# Patient Record
Sex: Male | Born: 1945 | Race: Black or African American | Hispanic: No | Marital: Married
Health system: Southern US, Community
[De-identification: ages and names within clinical notes are randomized; demographics above are authoritative.]

---

## 2020-08-16 ENCOUNTER — Other Ambulatory Visit (HOSPITAL_COMMUNITY): Payer: Medicare Other

## 2020-08-16 ENCOUNTER — Inpatient Hospital Stay
Admission: RE | Admit: 2020-08-16 | Discharge: 2020-11-28 | Disposition: A | Discharge status: Rehabilitation Facility | Payer: Medicare Other | Attending: Internal Medicine | Admitting: Internal Medicine

## 2020-08-16 DIAGNOSIS — R0602 Shortness of breath: Secondary | ICD-10-CM

## 2020-08-16 DIAGNOSIS — I639 Cerebral infarction, unspecified: Secondary | ICD-10-CM

## 2020-08-16 DIAGNOSIS — J189 Pneumonia, unspecified organism: Secondary | ICD-10-CM

## 2020-08-16 DIAGNOSIS — K5900 Constipation, unspecified: Secondary | ICD-10-CM

## 2020-08-16 DIAGNOSIS — G931 Anoxic brain damage, not elsewhere classified: Secondary | ICD-10-CM

## 2020-08-16 DIAGNOSIS — R52 Pain, unspecified: Secondary | ICD-10-CM

## 2020-08-16 DIAGNOSIS — T148XXA Other injury of unspecified body region, initial encounter: Secondary | ICD-10-CM

## 2020-08-16 DIAGNOSIS — J9690 Respiratory failure, unspecified, unspecified whether with hypoxia or hypercapnia: Secondary | ICD-10-CM

## 2020-08-16 DIAGNOSIS — R509 Fever, unspecified: Secondary | ICD-10-CM

## 2020-08-16 DIAGNOSIS — Z4659 Encounter for fitting and adjustment of other gastrointestinal appliance and device: Secondary | ICD-10-CM

## 2020-08-16 DIAGNOSIS — Z452 Encounter for adjustment and management of vascular access device: Secondary | ICD-10-CM

## 2020-08-16 DIAGNOSIS — K567 Ileus, unspecified: Secondary | ICD-10-CM

## 2020-08-16 DIAGNOSIS — I469 Cardiac arrest, cause unspecified: Secondary | ICD-10-CM

## 2020-08-16 DIAGNOSIS — Z9289 Personal history of other medical treatment: Secondary | ICD-10-CM

## 2020-08-16 DIAGNOSIS — J9621 Acute and chronic respiratory failure with hypoxia: Secondary | ICD-10-CM

## 2020-08-16 DIAGNOSIS — D72829 Elevated white blood cell count, unspecified: Secondary | ICD-10-CM

## 2020-08-16 LAB — BLOOD GAS, ARTERIAL
Acid-Base Excess: 3.5 mmol/L — ABNORMAL HIGH (ref 0.0–2.0)
Bicarbonate: 28.1 mmol/L — ABNORMAL HIGH (ref 20.0–28.0)
FIO2: 35
O2 Saturation: 96.7 %
Patient temperature: 37.5
pCO2 arterial: 47.9 mmHg (ref 32.0–48.0)
pH, Arterial: 7.388 (ref 7.350–7.450)
pO2, Arterial: 91.6 mmHg (ref 83.0–108.0)

## 2020-08-17 DIAGNOSIS — I639 Cerebral infarction, unspecified: Secondary | ICD-10-CM

## 2020-08-17 DIAGNOSIS — I469 Cardiac arrest, cause unspecified: Secondary | ICD-10-CM | POA: Diagnosis not present

## 2020-08-17 DIAGNOSIS — J9621 Acute and chronic respiratory failure with hypoxia: Secondary | ICD-10-CM

## 2020-08-17 DIAGNOSIS — J189 Pneumonia, unspecified organism: Secondary | ICD-10-CM

## 2020-08-17 DIAGNOSIS — G931 Anoxic brain damage, not elsewhere classified: Secondary | ICD-10-CM

## 2020-08-17 LAB — CBC
HCT: 27.8 % — ABNORMAL LOW (ref 39.0–52.0)
Hemoglobin: 8.4 g/dL — ABNORMAL LOW (ref 13.0–17.0)
MCH: 29.4 pg (ref 26.0–34.0)
MCHC: 30.2 g/dL (ref 30.0–36.0)
MCV: 97.2 fL (ref 80.0–100.0)
Platelets: 628 10*3/uL — ABNORMAL HIGH (ref 150–400)
RBC: 2.86 MIL/uL — ABNORMAL LOW (ref 4.22–5.81)
RDW: 15.9 % — ABNORMAL HIGH (ref 11.5–15.5)
WBC: 12.8 10*3/uL — ABNORMAL HIGH (ref 4.0–10.5)
nRBC: 0.2 % (ref 0.0–0.2)

## 2020-08-17 LAB — COMPREHENSIVE METABOLIC PANEL
ALT: 69 U/L — ABNORMAL HIGH (ref 0–44)
AST: 59 U/L — ABNORMAL HIGH (ref 15–41)
Albumin: 2.1 g/dL — ABNORMAL LOW (ref 3.5–5.0)
Alkaline Phosphatase: 131 U/L — ABNORMAL HIGH (ref 38–126)
Anion gap: 5 (ref 5–15)
BUN: 33 mg/dL — ABNORMAL HIGH (ref 8–23)
CO2: 29 mmol/L (ref 22–32)
Calcium: 8.9 mg/dL (ref 8.9–10.3)
Chloride: 114 mmol/L — ABNORMAL HIGH (ref 98–111)
Creatinine, Ser: 1.05 mg/dL (ref 0.61–1.24)
GFR, Estimated: >60 mL/min (ref >60)
Glucose, Bld: 154 mg/dL — ABNORMAL HIGH (ref 70–99)
Potassium: 4.7 mmol/L (ref 3.5–5.1)
Sodium: 148 mmol/L — ABNORMAL HIGH (ref 135–145)
Total Bilirubin: 0.8 mg/dL (ref 0.3–1.2)
Total Protein: 6.2 g/dL — ABNORMAL LOW (ref 6.5–8.1)

## 2020-08-17 LAB — URINALYSIS, ROUTINE W REFLEX MICROSCOPIC
Bilirubin Urine: NEGATIVE
Glucose, UA: NEGATIVE mg/dL
Ketones, ur: NEGATIVE mg/dL
Leukocytes,Ua: NEGATIVE
Nitrite: NEGATIVE
Protein, ur: NEGATIVE mg/dL
Specific Gravity, Urine: 1.015 (ref 1.005–1.030)
pH: 6 (ref 5.0–8.0)

## 2020-08-17 LAB — PROTIME-INR
INR: 1.2 (ref 0.8–1.2)
Prothrombin Time: 15 seconds (ref 11.4–15.2)

## 2020-08-17 LAB — URINALYSIS, MICROSCOPIC (REFLEX): Bacteria, UA: NONE SEEN

## 2020-08-17 LAB — TSH: TSH: 4.61 u[IU]/mL — ABNORMAL HIGH (ref 0.350–4.500)
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
## 2020-08-18 LAB — BASIC METABOLIC PANEL
Anion gap: 5 (ref 5–15)
BUN: 32 mg/dL — ABNORMAL HIGH (ref 8–23)
CO2: 27 mmol/L (ref 22–32)
Calcium: 8.3 mg/dL — ABNORMAL LOW (ref 8.9–10.3)
Chloride: 112 mmol/L — ABNORMAL HIGH (ref 98–111)
Creatinine, Ser: 0.98 mg/dL (ref 0.61–1.24)
GFR, Estimated: >60 mL/min (ref >60)
Glucose, Bld: 140 mg/dL — ABNORMAL HIGH (ref 70–99)
Potassium: 4.6 mmol/L (ref 3.5–5.1)
Sodium: 144 mmol/L (ref 135–145)

## 2020-08-18 LAB — CULTURE, RESPIRATORY W GRAM STAIN
Culture: NORMAL
Gram Stain: NONE SEEN

## 2020-08-20 DIAGNOSIS — J9621 Acute and chronic respiratory failure with hypoxia: Secondary | ICD-10-CM | POA: Diagnosis not present

## 2020-08-20 DIAGNOSIS — I469 Cardiac arrest, cause unspecified: Secondary | ICD-10-CM | POA: Diagnosis not present

## 2020-08-20 DIAGNOSIS — I639 Cerebral infarction, unspecified: Secondary | ICD-10-CM | POA: Diagnosis not present

## 2020-08-20 DIAGNOSIS — G931 Anoxic brain damage, not elsewhere classified: Secondary | ICD-10-CM | POA: Diagnosis not present

## 2020-08-20 NOTE — Progress Notes (Signed)
Pulmonary Critical Care Medicine [facility] [place]   PULMONARY CRITICAL CARE SERVICE  PROGRESS NOTE     Rick Wilson  FSE:[id number]  DOB: [birth year]   DOA: 08/16/2020  Referring Physician: Carron Curie, MD  HPI: Rick Wilson is a 75 y.o. male being followed for ventilator/airway/oxygen weaning Acute on Chronic Respiratory Failure.  Patient reportedly has very copious secretions.  Remains on the T collar right now on 28% FiO2.  Respiratory therapy reports frequent suctioning  Medications: Reviewed on Rounds  Physical Exam:  Vitals: Temperature was up to 101.7 pulse 99 respiratory rate 39 blood pressure 138/68 saturations were 100%  Ventilator Settings off the vent right now on T collar FiO2 28%  General: Comfortable at this time Neck: supple Cardiovascular: no malignant arrhythmias Respiratory: Coarse breath sounds rhonchi noted bilaterally Skin: no rash seen on limited exam Musculoskeletal: No gross abnormality Psychiatric:unable to assess Neurologic:no involuntary movements         Lab Data:   Basic Metabolic Panel: Recent Labs  Lab 08/17/20 0514 08/18/20 0646  NA 148* 144  K 4.7 4.6  CL 114* 112*  CO2 29 27  GLUCOSE 154* 140*  BUN 33* 32*  CREATININE 1.05 0.98  CALCIUM 8.9 8.3*    ABG: Recent Labs  Lab 08/16/20 1215  PHART 7.388  PCO2ART 47.9  PO2ART 91.6  HCO3 28.1*  O2SAT 96.7    Liver Function Tests: Recent Labs  Lab 08/17/20 0514  AST 59*  ALT 69*  ALKPHOS 131*  BILITOT 0.8  PROT 6.2*  ALBUMIN 2.1*   No results for input(s): LIPASE, AMYLASE in the last 168 hours. No results for input(s): AMMONIA in the last 168 hours.  CBC: Recent Labs  Lab 08/17/20 0514  WBC 12.8*  HGB 8.4*  HCT 27.8*  MCV 97.2  PLT 628*    Cardiac Enzymes: No results for input(s): CKTOTAL, CKMB, CKMBINDEX, TROPONINI in the last 168 hours.  BNP (last 3 results) No results for input(s): BNP in the last 8760 hours.  ProBNP (last 3  results) No results for input(s): PROBNP in the last 8760 hours.  Radiological Exams: No results found.  Assessment/Plan Active Problems:   Acute on chronic respiratory failure with hypoxia (HCC)   Cardiac arrest (HCC)   Chronic anoxic encephalopathy (HCC)   Cerebrovascular accident (CVA) (HCC)   Healthcare-associated pneumonia   Acute on chronic respiratory failure with hypoxia patient currently is on T collar has been on 28% FiO2 secretions are quite copious.  We will continue with oxygen therapy titrate as tolerated. Fever being worked up by primary team we will continue to monitor closely. Cardiac arrest rhythm is stable we will continue to monitor along closely. Chronic anoxic encephalopathy no change we will continue to follow Cerebrovascular accident supportive care Healthcare associated pneumonia has been treated with antibiotic   I have personally seen and evaluated the patient, evaluated laboratory and imaging results, formulated the assessment and plan and placed orders. The Patient requires high complexity decision making with multiple systems involvement.  Rounds were done with the Respiratory Therapy Director and Staff therapists and discussed with nursing staff also.  Yevonne Pax, MD [facility] Pulmonary Critical Care Medicine Sleep Medicine

## 2020-08-21 ENCOUNTER — Other Ambulatory Visit (HOSPITAL_COMMUNITY): Payer: Medicare Other

## 2020-08-21 DIAGNOSIS — G931 Anoxic brain damage, not elsewhere classified: Secondary | ICD-10-CM | POA: Diagnosis not present

## 2020-08-21 DIAGNOSIS — J9621 Acute and chronic respiratory failure with hypoxia: Secondary | ICD-10-CM

## 2020-08-21 DIAGNOSIS — I639 Cerebral infarction, unspecified: Secondary | ICD-10-CM

## 2020-08-21 DIAGNOSIS — I469 Cardiac arrest, cause unspecified: Secondary | ICD-10-CM

## 2020-08-21 DIAGNOSIS — J189 Pneumonia, unspecified organism: Secondary | ICD-10-CM

## 2020-08-21 LAB — URINALYSIS, ROUTINE W REFLEX MICROSCOPIC
Bacteria, UA: NONE SEEN
Bilirubin Urine: NEGATIVE
Glucose, UA: NEGATIVE mg/dL
Ketones, ur: NEGATIVE mg/dL
Leukocytes,Ua: NEGATIVE
Nitrite: NEGATIVE
Protein, ur: 30 mg/dL — AB
RBC / HPF: >50 RBC/hpf — ABNORMAL HIGH (ref 0–5)
Specific Gravity, Urine: 1.009 (ref 1.005–1.030)
pH: 6 (ref 5.0–8.0)

## 2020-08-21 NOTE — Progress Notes (Signed)
Pulmonary Critical Care Medicine [facility] [place]   PULMONARY CRITICAL CARE SERVICE  PROGRESS NOTE     Rick Wilson  HAL:[id number]  DOB: October 12, 1945   DOA: 08/16/2020  Referring Physician: Carron Curie, MD  HPI: Rick Wilson is a 75 y.o. male being followed for ventilator/airway/oxygen weaning Acute on Chronic Respiratory Failure.  Patient is comfortable right now without distress at this time remains off the ventilator on T collar secretions are still reportedly quite copious  Medications: Reviewed on Rounds  Physical Exam:  Vitals: Temperature is 98.9 pulse 89 respiratory rate is 33 blood pressure is 128/60 saturations 100%  Ventilator Settings currently is off the ventilator on T collar FiO2 28%  General: Comfortable at this time Neck: supple Cardiovascular: no malignant arrhythmias Respiratory: No rhonchi very coarse breath sound Skin: no rash seen on limited exam Musculoskeletal: No gross abnormality Psychiatric:unable to assess Neurologic:no involuntary movements         Lab Data:   Basic Metabolic Panel: Recent Labs  Lab 08/17/20 0514 08/18/20 0646  NA 148* 144  K 4.7 4.6  CL 114* 112*  CO2 29 27  GLUCOSE 154* 140*  BUN 33* 32*  CREATININE 1.05 0.98  CALCIUM 8.9 8.3*    ABG: Recent Labs  Lab 08/16/20 1215  PHART 7.388  PCO2ART 47.9  PO2ART 91.6  HCO3 28.1*  O2SAT 96.7    Liver Function Tests: Recent Labs  Lab 08/17/20 0514  AST 59*  ALT 69*  ALKPHOS 131*  BILITOT 0.8  PROT 6.2*  ALBUMIN 2.1*   No results for input(s): LIPASE, AMYLASE in the last 168 hours. No results for input(s): AMMONIA in the last 168 hours.  CBC: Recent Labs  Lab 08/17/20 0514  WBC 12.8*  HGB 8.4*  HCT 27.8*  MCV 97.2  PLT 628*    Cardiac Enzymes: No results for input(s): CKTOTAL, CKMB, CKMBINDEX, TROPONINI in the last 168 hours.  BNP (last 3 results) No results for input(s): BNP in the last 8760 hours.  ProBNP (last 3 results) No  results for input(s): PROBNP in the last 8760 hours.  Radiological Exams: CT ABDOMEN WO CONTRAST  Result Date: 08/21/2020 CLINICAL DATA:  Anatomic planning for gastrostomy tube EXAM: CT ABDOMEN WITHOUT CONTRAST TECHNIQUE: Multidetector CT imaging of the abdomen was performed following the standard protocol without IV contrast. COMPARISON:  None. FINDINGS: Lower chest: Small left pleural effusion and basilar atelectasis Hepatobiliary: No focal liver abnormality is seen. No gallstones, gallbladder wall thickening, or biliary dilatation. Pancreas: Unremarkable. No pancreatic ductal dilatation or surrounding inflammatory changes. Spleen: Normal in size without focal abnormality. Adrenals/Urinary Tract: Adrenal glands are unremarkable. Kidneys are normal, without renal calculi, focal lesion, or hydronephrosis. Stomach/Bowel: The distal transverse colon is interposed between the stomach in the anterior abdominal wall. Vascular/Lymphatic: No significant vascular findings are present. No enlarged abdominal or pelvic lymph nodes. Other: No abdominal wall hernia or abnormality. Musculoskeletal: Left sacroiliac fusion IMPRESSION: 1. The distal transverse colon is interposed between the stomach in the anterior abdominal wall. 2. Small left pleural effusion and basilar atelectasis. Electronically Signed   By: Deatra Robinson M.D.   On: 08/21/2020 03:03    Assessment/Plan Active Problems:   Acute on chronic respiratory failure with hypoxia (HCC)   Cardiac arrest (HCC)   Chronic anoxic encephalopathy (HCC)   Cerebrovascular accident (CVA) (HCC)   Healthcare-associated pneumonia   Acute on chronic respiratory failure hypoxia we will continue with the T-piece titrate oxygen limiting factor of secretions at this time Cardiac  arrest rhythm is stable at this time we will continue to follow along closely Chronic anoxic encephalopathy no change Cerebrovascular accident patient is at baseline Healthcare associated  pneumonia treated we will continue to follow along   I have personally seen and evaluated the patient, evaluated laboratory and imaging results, formulated the assessment and plan and placed orders. The Patient requires high complexity decision making with multiple systems involvement.  Rounds were done with the Respiratory Therapy Director and Staff therapists and discussed with nursing staff also.  Yevonne Pax, MD [facility] Pulmonary Critical Care Medicine Sleep Medicine

## 2020-08-22 ENCOUNTER — Other Ambulatory Visit (HOSPITAL_COMMUNITY): Payer: Medicare Other

## 2020-08-22 DIAGNOSIS — G931 Anoxic brain damage, not elsewhere classified: Secondary | ICD-10-CM | POA: Diagnosis not present

## 2020-08-22 DIAGNOSIS — I639 Cerebral infarction, unspecified: Secondary | ICD-10-CM | POA: Diagnosis not present

## 2020-08-22 DIAGNOSIS — J9621 Acute and chronic respiratory failure with hypoxia: Secondary | ICD-10-CM | POA: Diagnosis not present

## 2020-08-22 DIAGNOSIS — I469 Cardiac arrest, cause unspecified: Secondary | ICD-10-CM | POA: Diagnosis not present

## 2020-08-22 LAB — CBC
HCT: 25 % — ABNORMAL LOW (ref 39.0–52.0)
Hemoglobin: 7.9 g/dL — ABNORMAL LOW (ref 13.0–17.0)
MCH: 29.4 pg (ref 26.0–34.0)
MCHC: 31.6 g/dL (ref 30.0–36.0)
MCV: 92.9 fL (ref 80.0–100.0)
Platelets: 606 10*3/uL — ABNORMAL HIGH (ref 150–400)
RBC: 2.69 MIL/uL — ABNORMAL LOW (ref 4.22–5.81)
RDW: 13.9 % (ref 11.5–15.5)
WBC: 7.3 10*3/uL (ref 4.0–10.5)
nRBC: 0 % (ref 0.0–0.2)

## 2020-08-22 LAB — URINE CULTURE: Culture: NO GROWTH

## 2020-08-22 MED ORDER — IOHEXOL 300 MG/ML  SOLN
75.0000 mL | Freq: Once | INTRAMUSCULAR | Status: AC | PRN
  Administered 2020-08-22: 75 mL via INTRAVENOUS
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
## 2020-08-22 NOTE — Progress Notes (Signed)
Pulmonary Critical Care Medicine [facility] [place]   PULMONARY CRITICAL CARE SERVICE  PROGRESS NOTE     Pheng Prokop  NLZ:[id number]  DOB: 1945/11/03   DOA: 08/16/2020  Referring Physician: Carron Curie, MD  HPI: Rondal Vandevelde is a 75 y.o. male being followed for ventilator/airway/oxygen weaning Acute on Chronic Respiratory Failure.  Patient is on T collar currently on room air doing fairly well.  Medications: Reviewed on Rounds  Physical Exam:  Vitals: Temperature is 98.2 pulse 89 respiratory rate is 35 blood pressure is 139/73 saturations 100%  Ventilator Settings off the ventilator right now on room air  General: Comfortable at this time Neck: supple Cardiovascular: no malignant arrhythmias Respiratory: Scattered rhonchi expansion is equal Skin: no rash seen on limited exam Musculoskeletal: No gross abnormality Psychiatric:unable to assess Neurologic:no involuntary movements         Lab Data:   Basic Metabolic Panel: Recent Labs  Lab 08/17/20 0514 08/18/20 0646  NA 148* 144  K 4.7 4.6  CL 114* 112*  CO2 29 27  GLUCOSE 154* 140*  BUN 33* 32*  CREATININE 1.05 0.98  CALCIUM 8.9 8.3*    ABG: Recent Labs  Lab 08/16/20 1215  PHART 7.388  PCO2ART 47.9  PO2ART 91.6  HCO3 28.1*  O2SAT 96.7    Liver Function Tests: Recent Labs  Lab 08/17/20 0514  AST 59*  ALT 69*  ALKPHOS 131*  BILITOT 0.8  PROT 6.2*  ALBUMIN 2.1*   No results for input(s): LIPASE, AMYLASE in the last 168 hours. No results for input(s): AMMONIA in the last 168 hours.  CBC: Recent Labs  Lab 08/17/20 0514  WBC 12.8*  HGB 8.4*  HCT 27.8*  MCV 97.2  PLT 628*    Cardiac Enzymes: No results for input(s): CKTOTAL, CKMB, CKMBINDEX, TROPONINI in the last 168 hours.  BNP (last 3 results) No results for input(s): BNP in the last 8760 hours.  ProBNP (last 3 results) No results for input(s): PROBNP in the last 8760 hours.  Radiological Exams: CT ABDOMEN WO  CONTRAST  Result Date: 08/21/2020 CLINICAL DATA:  Anatomic planning for gastrostomy tube EXAM: CT ABDOMEN WITHOUT CONTRAST TECHNIQUE: Multidetector CT imaging of the abdomen was performed following the standard protocol without IV contrast. COMPARISON:  None. FINDINGS: Lower chest: Small left pleural effusion and basilar atelectasis Hepatobiliary: No focal liver abnormality is seen. No gallstones, gallbladder wall thickening, or biliary dilatation. Pancreas: Unremarkable. No pancreatic ductal dilatation or surrounding inflammatory changes. Spleen: Normal in size without focal abnormality. Adrenals/Urinary Tract: Adrenal glands are unremarkable. Kidneys are normal, without renal calculi, focal lesion, or hydronephrosis. Stomach/Bowel: The distal transverse colon is interposed between the stomach in the anterior abdominal wall. Vascular/Lymphatic: No significant vascular findings are present. No enlarged abdominal or pelvic lymph nodes. Other: No abdominal wall hernia or abnormality. Musculoskeletal: Left sacroiliac fusion IMPRESSION: 1. The distal transverse colon is interposed between the stomach in the anterior abdominal wall. 2. Small left pleural effusion and basilar atelectasis. Electronically Signed   By: Deatra Robinson M.D.   On: 08/21/2020 03:03   CT SOFT TISSUE NECK W CONTRAST  Result Date: 08/22/2020 CLINICAL DATA:  Neck pain, chronic, no prior imaging EXAM: CT NECK WITH CONTRAST TECHNIQUE: Multidetector CT imaging of the neck was performed using the standard protocol following the bolus administration of intravenous contrast. CONTRAST:  8mL OMNIPAQUE IOHEXOL 300 MG/ML  SOLN COMPARISON:  None. FINDINGS: PHARYNX AND LARYNX: There is circumferential soft tissue thickening of the supraglottic larynx. Tracheostomy tube tip  is at the level of the clavicular heads. No retropharyngeal abnormality. SALIVARY GLANDS: Normal parotid, submandibular and sublingual glands. THYROID: Normal. LYMPH NODES: No enlarged  or abnormal density lymph nodes. VASCULAR: Major cervical vessels are patent. LIMITED INTRACRANIAL: Normal. VISUALIZED ORBITS: Normal. MASTOIDS AND VISUALIZED PARANASAL SINUSES: Partial opacification of the maxillary sinuses and near complete opacification of the sphenoid sinus. SKELETON: No bony spinal canal stenosis. No lytic or blastic lesions. UPPER CHEST: Incompletely visualized left pleural effusion. OTHER: None. IMPRESSION: 1. Circumferential edema of the supraglottic larynx. 2. Tracheostomy tube tip is at the level of the clavicular heads. Electronically Signed   By: Deatra Robinson M.D.   On: 08/22/2020 02:32   CT CHEST W CONTRAST  Result Date: 08/22/2020 CLINICAL DATA:  Pneumonia EXAM: CT CHEST WITH CONTRAST TECHNIQUE: Multidetector CT imaging of the chest was performed during intravenous contrast administration. CONTRAST:  78mL OMNIPAQUE IOHEXOL 300 MG/ML  SOLN COMPARISON:  Chest radiograph dated 08/16/2020 FINDINGS: Cardiovascular: Heart is top-normal in size. No pericardial effusion. No evidence of thoracic aortic aneurysm. Mild atherosclerotic calcifications of the aortic arch. Coronary atherosclerosis of the LAD. Mediastinum/Nodes: No suspicious mediastinal lymphadenopathy. Visualized thyroid is unremarkable. Lungs/Pleura: Tracheostomy in satisfactory position. Evaluation of the lung parenchyma is constrained by respiratory motion. Within that constraint, there are no suspicious pulmonary nodules. Small left pleural effusion. Associated left lower lobe opacity, likely atelectasis. Trace right pleural effusion Mild linear scarring/atelectasis in the right upper lobe (series 8/image 44) and superior segment right lower lobe (series 8/image 53). No focal consolidation. No pneumothorax. Upper Abdomen: Enteric tube terminates in the antral pyloric region. Musculoskeletal: Comminuted left lateral clavicle fracture (series 4/image 8). Segmental left lateral and anterior 2nd through 4th rib fractures. Left  lateral 5th through 9th rib fractures. Suspected nondisplaced right anterolateral 3rd through 6th rib fractures. Sternum, scapulae, and thoracic spine are within normal limits. IMPRESSION: Multiple bilateral rib fractures, as above. Comminuted left lateral clavicle fracture. Small left and trace right pleural effusions. Associated left lower lobe opacity, likely atelectasis. No findings suspicious for pneumonia. Support apparatus as above. Aortic Atherosclerosis (ICD10-I70.0). Electronically Signed   By: Charline Bills M.D.   On: 08/22/2020 02:35    Assessment/Plan Active Problems:   Acute on chronic respiratory failure with hypoxia (HCC)   Cardiac arrest (HCC)   Chronic anoxic encephalopathy (HCC)   Cerebrovascular accident (CVA) (HCC)   Healthcare-associated pneumonia   Acute on chronic respiratory failure with hypoxia continue with the T collar titrate oxygen as tolerated right now is not requiring any oxygen secretions are moderate Cardiac arrest rhythm is stable we will monitor closely Chronic anoxic encephalopathy no change continue to follow along closely CVA no change supportive care at this time Healthcare associated pneumonia treated   I have personally seen and evaluated the patient, evaluated laboratory and imaging results, formulated the assessment and plan and placed orders. The Patient requires high complexity decision making with multiple systems involvement.  Rounds were done with the Respiratory Therapy Director and Staff therapists and discussed with nursing staff also.  Yevonne Pax, MD [facility] Pulmonary Critical Care Medicine Sleep Medicine

## 2020-08-23 ENCOUNTER — Other Ambulatory Visit (HOSPITAL_COMMUNITY): Payer: Medicare Other

## 2020-08-23 DIAGNOSIS — I469 Cardiac arrest, cause unspecified: Secondary | ICD-10-CM | POA: Diagnosis not present

## 2020-08-23 DIAGNOSIS — I639 Cerebral infarction, unspecified: Secondary | ICD-10-CM | POA: Diagnosis not present

## 2020-08-23 DIAGNOSIS — G931 Anoxic brain damage, not elsewhere classified: Secondary | ICD-10-CM | POA: Diagnosis not present

## 2020-08-23 DIAGNOSIS — J9621 Acute and chronic respiratory failure with hypoxia: Secondary | ICD-10-CM | POA: Diagnosis not present

## 2020-08-23 HISTORY — PX: IR GASTROSTOMY TUBE MOD SED: IMG625

## 2020-08-23 LAB — CULTURE, RESPIRATORY W GRAM STAIN

## 2020-08-23 MED ORDER — STERILE WATER FOR INJECTION IJ SOLN
INTRAMUSCULAR | Status: AC
  Filled 2020-08-23: qty 10

## 2020-08-23 MED ORDER — CEFAZOLIN SODIUM-DEXTROSE 2-4 GM/100ML-% IV SOLN
INTRAVENOUS | Status: AC
  Administered 2020-08-23: 2000 mg
  Filled 2020-08-23: qty 100

## 2020-08-23 MED ORDER — IOHEXOL 300 MG/ML  SOLN
50.0000 mL | Freq: Once | INTRAMUSCULAR | Status: AC | PRN
  Administered 2020-08-23: 20 mL

## 2020-08-23 MED ORDER — LIDOCAINE-EPINEPHRINE 1 %-1:100000 IJ SOLN
INTRAMUSCULAR | Status: AC
  Filled 2020-08-23: qty 1

## 2020-08-23 MED ORDER — MIDAZOLAM HCL 2 MG/2ML IJ SOLN
INTRAMUSCULAR | Status: AC | PRN
  Administered 2020-08-23 (×2): 1 mg via INTRAVENOUS

## 2020-08-23 MED ORDER — MIDAZOLAM HCL 2 MG/2ML IJ SOLN
INTRAMUSCULAR | Status: AC
  Filled 2020-08-23: qty 2

## 2020-08-23 MED ORDER — FENTANYL CITRATE (PF) 100 MCG/2ML IJ SOLN
INTRAMUSCULAR | Status: AC | PRN
  Administered 2020-08-23: 25 ug via INTRAVENOUS
  Administered 2020-08-23: 50 ug via INTRAVENOUS

## 2020-08-23 MED ORDER — FENTANYL CITRATE (PF) 100 MCG/2ML IJ SOLN
INTRAMUSCULAR | Status: AC
  Filled 2020-08-23: qty 2

## 2020-08-23 MED ORDER — CEFAZOLIN (ANCEF) 1 G IV SOLR: INTRAVENOUS | Status: AC | PRN

## 2020-08-23 MED ORDER — LIDOCAINE-EPINEPHRINE (PF) 1 %-1:200000 IJ SOLN
INTRAMUSCULAR | Status: AC | PRN
Start: 2020-08-23 — End: 2020-08-23
  Administered 2020-08-23: 20 mL

## 2020-08-23 NOTE — Progress Notes (Signed)
Pulmonary Critical Care Medicine [facility] [place]   PULMONARY CRITICAL CARE SERVICE  PROGRESS NOTE     Rick Wilson  WTU:[id number]  DOB: 02-18-1945   DOA: 08/16/2020  Referring Physician: Carron Curie, MD  HPI: Rick Wilson is a 75 y.o. male being followed for ventilator/airway/oxygen weaning Acute on Chronic Respiratory Failure.  Patient is currently off the ventilator has been on T collar and is on room air doing quite well.  Medications: Reviewed on Rounds  Physical Exam:  Vitals: Temperature 97.3 pulse 96 respiratory rate is 20 blood pressure is 136/67 saturations 99%  Ventilator Settings on T collar room air  General: Comfortable at this time Neck: supple Cardiovascular: no malignant arrhythmias Respiratory: No rhonchi very coarse breath sounds Skin: no rash seen on limited exam Musculoskeletal: No gross abnormality Psychiatric:unable to assess Neurologic:no involuntary movements         Lab Data:   Basic Metabolic Panel: Recent Labs  Lab 08/17/20 0514 08/18/20 0646  NA 148* 144  K 4.7 4.6  CL 114* 112*  CO2 29 27  GLUCOSE 154* 140*  BUN 33* 32*  CREATININE 1.05 0.98  CALCIUM 8.9 8.3*    ABG: Recent Labs  Lab 08/16/20 1215  PHART 7.388  PCO2ART 47.9  PO2ART 91.6  HCO3 28.1*  O2SAT 96.7    Liver Function Tests: Recent Labs  Lab 08/17/20 0514  AST 59*  ALT 69*  ALKPHOS 131*  BILITOT 0.8  PROT 6.2*  ALBUMIN 2.1*   No results for input(s): LIPASE, AMYLASE in the last 168 hours. No results for input(s): AMMONIA in the last 168 hours.  CBC: Recent Labs  Lab 08/17/20 0514 08/22/20 1633  WBC 12.8* 7.3  HGB 8.4* 7.9*  HCT 27.8* 25.0*  MCV 97.2 92.9  PLT 628* 606*    Cardiac Enzymes: No results for input(s): CKTOTAL, CKMB, CKMBINDEX, TROPONINI in the last 168 hours.  BNP (last 3 results) No results for input(s): BNP in the last 8760 hours.  ProBNP (last 3 results) No results for input(s): PROBNP in the last 8760  hours.  Radiological Exams: CT SOFT TISSUE NECK W CONTRAST  Result Date: 08/22/2020 CLINICAL DATA:  Neck pain, chronic, no prior imaging EXAM: CT NECK WITH CONTRAST TECHNIQUE: Multidetector CT imaging of the neck was performed using the standard protocol following the bolus administration of intravenous contrast. CONTRAST:  56mL OMNIPAQUE IOHEXOL 300 MG/ML  SOLN COMPARISON:  None. FINDINGS: PHARYNX AND LARYNX: There is circumferential soft tissue thickening of the supraglottic larynx. Tracheostomy tube tip is at the level of the clavicular heads. No retropharyngeal abnormality. SALIVARY GLANDS: Normal parotid, submandibular and sublingual glands. THYROID: Normal. LYMPH NODES: No enlarged or abnormal density lymph nodes. VASCULAR: Major cervical vessels are patent. LIMITED INTRACRANIAL: Normal. VISUALIZED ORBITS: Normal. MASTOIDS AND VISUALIZED PARANASAL SINUSES: Partial opacification of the maxillary sinuses and near complete opacification of the sphenoid sinus. SKELETON: No bony spinal canal stenosis. No lytic or blastic lesions. UPPER CHEST: Incompletely visualized left pleural effusion. OTHER: None. IMPRESSION: 1. Circumferential edema of the supraglottic larynx. 2. Tracheostomy tube tip is at the level of the clavicular heads. Electronically Signed   By: Deatra Robinson M.D.   On: 08/22/2020 02:32   CT CHEST W CONTRAST  Result Date: 08/22/2020 CLINICAL DATA:  Pneumonia EXAM: CT CHEST WITH CONTRAST TECHNIQUE: Multidetector CT imaging of the chest was performed during intravenous contrast administration. CONTRAST:  70mL OMNIPAQUE IOHEXOL 300 MG/ML  SOLN COMPARISON:  Chest radiograph dated 08/16/2020 FINDINGS: Cardiovascular: Heart is top-normal  in size. No pericardial effusion. No evidence of thoracic aortic aneurysm. Mild atherosclerotic calcifications of the aortic arch. Coronary atherosclerosis of the LAD. Mediastinum/Nodes: No suspicious mediastinal lymphadenopathy. Visualized thyroid is unremarkable.  Lungs/Pleura: Tracheostomy in satisfactory position. Evaluation of the lung parenchyma is constrained by respiratory motion. Within that constraint, there are no suspicious pulmonary nodules. Small left pleural effusion. Associated left lower lobe opacity, likely atelectasis. Trace right pleural effusion Mild linear scarring/atelectasis in the right upper lobe (series 8/image 44) and superior segment right lower lobe (series 8/image 53). No focal consolidation. No pneumothorax. Upper Abdomen: Enteric tube terminates in the antral pyloric region. Musculoskeletal: Comminuted left lateral clavicle fracture (series 4/image 8). Segmental left lateral and anterior 2nd through 4th rib fractures. Left lateral 5th through 9th rib fractures. Suspected nondisplaced right anterolateral 3rd through 6th rib fractures. Sternum, scapulae, and thoracic spine are within normal limits. IMPRESSION: Multiple bilateral rib fractures, as above. Comminuted left lateral clavicle fracture. Small left and trace right pleural effusions. Associated left lower lobe opacity, likely atelectasis. No findings suspicious for pneumonia. Support apparatus as above. Aortic Atherosclerosis (ICD10-I70.0). Electronically Signed   By: Charline Bills M.D.   On: 08/22/2020 02:35    Assessment/Plan Active Problems:   Acute on chronic respiratory failure with hypoxia (HCC)   Cardiac arrest (HCC)   Chronic anoxic encephalopathy (HCC)   Cerebrovascular accident (CVA) (HCC)   Healthcare-associated pneumonia   Acute on chronic respiratory failure with hypoxia plan is going to be to continue with T-piece.  Continue to advance weaning as tolerated. Cardiac arrest rhythm is stable at this time we will continue to follow along closely. Chronic anoxic encephalopathy overall no change CVA supportive care Healthcare associated pneumonia treated patient on the last chest film did not really show any new infiltrates has multiple fractures noted as  previous   I have personally seen and evaluated the patient, evaluated laboratory and imaging results, formulated the assessment and plan and placed orders. The Patient requires high complexity decision making with multiple systems involvement.  Rounds were done with the Respiratory Therapy Director and Staff therapists and discussed with nursing staff also.  Yevonne Pax, MD [facility] Pulmonary Critical Care Medicine Sleep Medicine

## 2020-08-23 NOTE — Procedures (Signed)
Interventional Radiology Procedure Note  Procedure: Placement of percutaneous 20F balloon retention gastrostomy tube.  Complications: None  Recommendations: - NPO except for sips and chips remainder of today and overnight - Maintain G-tube to LWS until tomorrow morning  - May advance diet as tolerated and begin using tube tomorrow morning   Tucker Minter, MD    

## 2020-08-24 DIAGNOSIS — I639 Cerebral infarction, unspecified: Secondary | ICD-10-CM | POA: Diagnosis not present

## 2020-08-24 DIAGNOSIS — J9621 Acute and chronic respiratory failure with hypoxia: Secondary | ICD-10-CM | POA: Diagnosis not present

## 2020-08-24 DIAGNOSIS — G931 Anoxic brain damage, not elsewhere classified: Secondary | ICD-10-CM | POA: Diagnosis not present

## 2020-08-24 DIAGNOSIS — I469 Cardiac arrest, cause unspecified: Secondary | ICD-10-CM | POA: Diagnosis not present

## 2020-08-24 LAB — BASIC METABOLIC PANEL
Anion gap: 7 (ref 5–15)
BUN: 37 mg/dL — ABNORMAL HIGH (ref 8–23)
CO2: 26 mmol/L (ref 22–32)
Calcium: 8.3 mg/dL — ABNORMAL LOW (ref 8.9–10.3)
Chloride: 102 mmol/L (ref 98–111)
Creatinine, Ser: 1.03 mg/dL (ref 0.61–1.24)
GFR, Estimated: >60 mL/min (ref >60)
Glucose, Bld: 157 mg/dL — ABNORMAL HIGH (ref 70–99)
Potassium: 4.8 mmol/L (ref 3.5–5.1)
Sodium: 135 mmol/L (ref 135–145)

## 2020-08-24 LAB — CBC
HCT: 25.5 % — ABNORMAL LOW (ref 39.0–52.0)
Hemoglobin: 8.1 g/dL — ABNORMAL LOW (ref 13.0–17.0)
MCH: 29.1 pg (ref 26.0–34.0)
MCHC: 31.8 g/dL (ref 30.0–36.0)
MCV: 91.7 fL (ref 80.0–100.0)
Platelets: 622 10*3/uL — ABNORMAL HIGH (ref 150–400)
RBC: 2.78 MIL/uL — ABNORMAL LOW (ref 4.22–5.81)
RDW: 14 % (ref 11.5–15.5)
WBC: 10.5 10*3/uL (ref 4.0–10.5)
nRBC: 0.2 % (ref 0.0–0.2)

## 2020-08-24 LAB — MAGNESIUM: Magnesium: 2.4 mg/dL (ref 1.7–2.4)

## 2020-08-24 NOTE — Progress Notes (Signed)
Rick Wilson is a 75 y.o. male s/p image guided gastrostomy tube placement with Dr. Elby Showers yesterday.   G tube site appears clean, dry, no bleeding, no sign of acute infection.  Has T-tac, will need to be removed after 7 days. Bowel sound presents all four quadrants.   Ok to use the gastrostomy tube.   Please call IR for questions and concerns regarding the G tube.    [name] Rick Tonne PA-C 08/24/2020 3:46 PM

## 2020-08-24 NOTE — Progress Notes (Signed)
Pulmonary Critical Care Medicine [facility] [place]   PULMONARY CRITICAL CARE SERVICE  PROGRESS NOTE     Rick Wilson  ZOX:[id number]  DOB: 05-May-1945   DOA: 08/16/2020  Referring Physician: Carron Curie, MD  HPI: Rick Wilson is a 75 y.o. male being followed for ventilator/airway/oxygen weaning Acute on Chronic Respiratory Failure.  Patient currently is on T collar has been using PMV doing quite well.  No fevers noted  Medications: Reviewed on Rounds  Physical Exam:  Vitals: Temperature is 98.2 pulse 84 respiratory rate is 27 blood pressure 142/85 saturations 99%  Ventilator Settings T collar FiO2 28%  General: Comfortable at this time Neck: supple Cardiovascular: no malignant arrhythmias Respiratory: No rhonchi no rales are noted at this time Skin: no rash seen on limited exam Musculoskeletal: No gross abnormality Psychiatric:unable to assess Neurologic:no involuntary movements         Lab Data:   Basic Metabolic Panel: Recent Labs  Lab 08/18/20 0646 08/24/20 0449  NA 144 135  K 4.6 4.8  CL 112* 102  CO2 27 26  GLUCOSE 140* 157*  BUN 32* 37*  CREATININE 0.98 1.03  CALCIUM 8.3* 8.3*  MG  --  2.4    ABG: No results for input(s): PHART, PCO2ART, PO2ART, HCO3, O2SAT in the last 168 hours.  Liver Function Tests: No results for input(s): AST, ALT, ALKPHOS, BILITOT, PROT, ALBUMIN in the last 168 hours. No results for input(s): LIPASE, AMYLASE in the last 168 hours. No results for input(s): AMMONIA in the last 168 hours.  CBC: Recent Labs  Lab 08/22/20 1633 08/24/20 0449  WBC 7.3 10.5  HGB 7.9* 8.1*  HCT 25.0* 25.5*  MCV 92.9 91.7  PLT 606* 622*    Cardiac Enzymes: No results for input(s): CKTOTAL, CKMB, CKMBINDEX, TROPONINI in the last 168 hours.  BNP (last 3 results) No results for input(s): BNP in the last 8760 hours.  ProBNP (last 3 results) No results for input(s): PROBNP in the last 8760 hours.  Radiological Exams: IR  GASTROSTOMY TUBE MOD SED  Result Date: 08/23/2020 INDICATION: 75 year old male altered mental status and dysphagia after trauma. EXAM: PERC PLACEMENT GASTROSTOMY MEDICATIONS: 2 mg Ancef, intravenous; Antibiotics were administered within 1 hour of the procedure. ANESTHESIA/SEDATION: Versed 2 mg IV; Fentanyl 75 mcg IV Moderate Sedation Time:  17 The patient was continuously monitored during the procedure by the interventional radiology nurse under my direct supervision. CONTRAST:  65mL OMNIPAQUE IOHEXOL 300 MG/ML SOLN - administered into the gastric lumen. FLUOROSCOPY TIME:  Fluoroscopy Time: 0 minutes 48 seconds (14 mGy). COMPLICATIONS: None immediate. PROCEDURE: Informed written consent was obtained from the patient after a thorough discussion of the procedural risks, benefits and alternatives. All questions were addressed. Maximal Sterile Barrier Technique was utilized including caps, mask, sterile gowns, sterile gloves, sterile drape, hand hygiene and skin antiseptic. A timeout was performed prior to the initiation of the procedure. The patient was placed on the procedure table in the supine position. Pre-procedure abdominal film confirmed visualization of the transverse colon. An angled 5-French catheter was passed through the mouth into the stomach. The patient was prepped and draped in usual sterile fashion. Te stomach was insufflated with air via the indwelling nasogastric tube. Under fluoroscopy, a puncture site was selected and local analgesia achieved with 1% lidocaine infiltrated subcutaneously. Under fluoroscopic guidance, a gastropexy needle was passed into the stomach and the T-bar suture was released. Entry into the stomach was confirmed with fluoroscopy, aspiration of air, and injection of contrast material.  This was repeated with an additional gastropexy suture (for a total of 2 fasteners). At the center of these gastropexy sutures, a dermatotomy was performed. An 18 gauge needle was passed into  the stomach at the site of this dermatotomy, and position within the gastric lumen again confirmed under fluoroscopy using aspiration of air and contrast injection. An Amplatz guidewire was passed through this needle and intraluminal placement within the stomach was confirmed by fluoroscopy. The needle was removed. Over the guidewire, the percutaneous tract was dilated using a 10 mm non-compliant balloon. The balloon was deflated, then pushed into the gastric lumen followed in concert by the 20 Fr gastrostomy tube. The retention balloon of the percutaneous gastrostomy tube was inflated with 10 mL of sterile water. The tube was withdrawn until the retention balloon was at the edge of the gastric lumen. The external bumper was brought to the abdominal wall. Contrast was injected through the gastrostomy tube, confirming intraluminal positioning. The patient tolerated the procedure well without any immediate post-procedural complications. IMPRESSION: Technically successful placement of 20 Fr gastrostomy tube with absorbable gastropexy anchor sutures. Marliss Coots, MD [facility] Radiology Electronically Signed   By: Marliss Coots MD   On: 08/23/2020 12:55    Assessment/Plan Active Problems:   Acute on chronic respiratory failure with hypoxia (HCC)   Cardiac arrest (HCC)   Chronic anoxic encephalopathy (HCC)   Cerebrovascular accident (CVA) (HCC)   Healthcare-associated pneumonia   Acute on chronic respiratory failure hypoxia plan is to continue with the T-piece.  Patient should have a trach change done to a #6 trach which will be done today Cardiac arrest right now rhythm is stable we will continue to monitor along closely. Chronic anoxic encephalopathy overall no change supportive care Acute stroke multiple strokes noted.  Patient's mental status remains unchanged Healthcare associated pneumonia has been treated with antibiotics   I have personally seen  and evaluated the patient, evaluated laboratory and imaging results, formulated the assessment and plan and placed orders. The Patient requires high complexity decision making with multiple systems involvement.  Rounds were done with the Respiratory Therapy Director and Staff therapists and discussed with nursing staff also.  Yevonne Pax, MD [facility] Pulmonary Critical Care Medicine Sleep Medicine

## 2020-08-25 LAB — CULTURE, BLOOD (ROUTINE X 2)
Culture: NO GROWTH
Culture: NO GROWTH
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
## 2020-08-27 ENCOUNTER — Other Ambulatory Visit (HOSPITAL_COMMUNITY): Payer: Medicare Other

## 2020-08-27 DIAGNOSIS — G931 Anoxic brain damage, not elsewhere classified: Secondary | ICD-10-CM | POA: Diagnosis not present

## 2020-08-27 DIAGNOSIS — J9621 Acute and chronic respiratory failure with hypoxia: Secondary | ICD-10-CM | POA: Diagnosis not present

## 2020-08-27 DIAGNOSIS — I639 Cerebral infarction, unspecified: Secondary | ICD-10-CM | POA: Diagnosis not present

## 2020-08-27 DIAGNOSIS — I469 Cardiac arrest, cause unspecified: Secondary | ICD-10-CM | POA: Diagnosis not present

## 2020-08-27 LAB — CBC
HCT: 30.9 % — ABNORMAL LOW (ref 39.0–52.0)
Hemoglobin: 9.9 g/dL — ABNORMAL LOW (ref 13.0–17.0)
MCH: 29.8 pg (ref 26.0–34.0)
MCHC: 32 g/dL (ref 30.0–36.0)
MCV: 93.1 fL (ref 80.0–100.0)
Platelets: 711 10*3/uL — ABNORMAL HIGH (ref 150–400)
RBC: 3.32 MIL/uL — ABNORMAL LOW (ref 4.22–5.81)
RDW: 14.5 % (ref 11.5–15.5)
WBC: 15.9 10*3/uL — ABNORMAL HIGH (ref 4.0–10.5)
nRBC: 0.7 % — ABNORMAL HIGH (ref 0.0–0.2)

## 2020-08-27 LAB — BASIC METABOLIC PANEL
Anion gap: 7 (ref 5–15)
BUN: 34 mg/dL — ABNORMAL HIGH (ref 8–23)
CO2: 26 mmol/L (ref 22–32)
Calcium: 8.1 mg/dL — ABNORMAL LOW (ref 8.9–10.3)
Chloride: 103 mmol/L (ref 98–111)
Creatinine, Ser: 0.96 mg/dL (ref 0.61–1.24)
GFR, Estimated: >60 mL/min (ref >60)
Glucose, Bld: 136 mg/dL — ABNORMAL HIGH (ref 70–99)
Potassium: 5.2 mmol/L — ABNORMAL HIGH (ref 3.5–5.1)
Sodium: 136 mmol/L (ref 135–145)

## 2020-08-27 NOTE — Progress Notes (Signed)
Pulmonary Critical Care Medicine [facility] [place]   PULMONARY CRITICAL CARE SERVICE  PROGRESS NOTE     Rhet Rorke  SHF:[id number]  DOB: 10-26-1945   DOA: 08/16/2020  Referring Physician: Carron Curie, MD  HPI: Rick Wilson is a 75 y.o. male being followed for ventilator/airway/oxygen weaning Acute on Chronic Respiratory Failure.  Patient is currently on T collar has been on room air secretions are copious  Medications: Reviewed on Rounds  Physical Exam:  Vitals: Temperature is 98.0 pulse 60 respiratory 25 blood pressure is 134/72 saturations 94%  Ventilator Settings off ventilator on T collar  General: Comfortable at this time Neck: supple Cardiovascular: no malignant arrhythmias Respiratory: No rhonchi very coarse breath sounds Skin: no rash seen on limited exam Musculoskeletal: No gross abnormality Psychiatric:unable to assess Neurologic:no involuntary movements         Lab Data:   Basic Metabolic Panel: Recent Labs  Lab 08/24/20 0449 08/27/20 1200  NA 135 136  K 4.8 5.2*  CL 102 103  CO2 26 26  GLUCOSE 157* 136*  BUN 37* 34*  CREATININE 1.03 0.96  CALCIUM 8.3* 8.1*  MG 2.4  --     ABG: No results for input(s): PHART, PCO2ART, PO2ART, HCO3, O2SAT in the last 168 hours.  Liver Function Tests: No results for input(s): AST, ALT, ALKPHOS, BILITOT, PROT, ALBUMIN in the last 168 hours. No results for input(s): LIPASE, AMYLASE in the last 168 hours. No results for input(s): AMMONIA in the last 168 hours.  CBC: Recent Labs  Lab 08/22/20 1633 08/24/20 0449 08/27/20 1200  WBC 7.3 10.5 15.9*  HGB 7.9* 8.1* 9.9*  HCT 25.0* 25.5* 30.9*  MCV 92.9 91.7 93.1  PLT 606* 622* 711*    Cardiac Enzymes: No results for input(s): CKTOTAL, CKMB, CKMBINDEX, TROPONINI in the last 168 hours.  BNP (last 3 results) No results for input(s): BNP in the last 8760 hours.  ProBNP (last 3 results) No results for input(s): PROBNP in the last 8760  hours.  Radiological Exams: DG CHEST PORT 1 VIEW  Result Date: 08/27/2020 CLINICAL DATA:  Elevated WBC EXAM: PORTABLE CHEST 1 VIEW COMPARISON:  08/16/2020 FINDINGS: The heart size and mediastinal contours are within normal limits. Tracheostomy. Bandlike scarring or atelectasis of the right midlung. No new or acute airspace opacity. Redemonstrated minimally displaced oblique fracture of the middle third of the left clavicle as well as multiple lateral left rib fractures. IMPRESSION: 1. No acute abnormality of the lungs in AP portable projection. 2. Tracheostomy. 3. Redemonstrated minimally displaced oblique fracture of the middle third of the left clavicle as well as multiple lateral left rib fractures. Electronically Signed   By: Lauralyn Primes M.D.   On: 08/27/2020 18:24    Assessment/Plan Active Problems:   Acute on chronic respiratory failure with hypoxia (HCC)   Cardiac arrest (HCC)   Chronic anoxic encephalopathy (HCC)   Cerebrovascular accident (CVA) (HCC)   Healthcare-associated pneumonia   Acute on chronic respiratory failure hypoxia doing well with T collar still has copious secretions as a limiting factor for being able to wean. Cardiac arrest rhythm is stable at this time Chronic anoxic encephalopathy no change we will continue to follow along Acute stroke patient is at baseline Healthcare associated pneumonia treated slow improvement we will continue to monitor with follow-up x-rays   I have personally seen and evaluated the patient, evaluated laboratory and imaging results, formulated the assessment and plan and placed orders. The Patient requires high complexity decision making with multiple  systems involvement.  Rounds were done with the Respiratory Therapy Director and Staff therapists and discussed with nursing staff also.  Allyne Gee, MD [facility] Pulmonary Critical Care Medicine Sleep Medicine

## 2020-08-28 DIAGNOSIS — I639 Cerebral infarction, unspecified: Secondary | ICD-10-CM | POA: Diagnosis not present

## 2020-08-28 DIAGNOSIS — G931 Anoxic brain damage, not elsewhere classified: Secondary | ICD-10-CM | POA: Diagnosis not present

## 2020-08-28 DIAGNOSIS — J9621 Acute and chronic respiratory failure with hypoxia: Secondary | ICD-10-CM | POA: Diagnosis not present

## 2020-08-28 DIAGNOSIS — I469 Cardiac arrest, cause unspecified: Secondary | ICD-10-CM | POA: Diagnosis not present

## 2020-08-28 LAB — POTASSIUM: Potassium: 5.2 mmol/L — ABNORMAL HIGH (ref 3.5–5.1)

## 2020-08-28 NOTE — Progress Notes (Signed)
Pulmonary Critical Care Medicine [facility] [place]   PULMONARY CRITICAL CARE SERVICE  PROGRESS NOTE     Rick Wilson  QIH:[id number]  DOB: [birth year]   DOA: 08/16/2020  Referring Physician: Carron Curie, MD  HPI: Rick Wilson is a 75 y.o. male being followed for ventilator/airway/oxygen weaning Acute on Chronic Respiratory Failure.  Patient is on T collar currently on room air doing well should be started to use the PMV  Medications: Reviewed on Rounds  Physical Exam:  Vitals: Temperature is 97.4 pulse 85 respiratory rate is 20 blood pressure is 159/81 saturations 100%  Ventilator Settings currently is on T collar room air  General: Comfortable at this time Neck: supple Cardiovascular: no malignant arrhythmias Respiratory: No rhonchi no rales are noted at this time Skin: no rash seen on limited exam Musculoskeletal: No gross abnormality Psychiatric:unable to assess Neurologic:no involuntary movements         Lab Data:   Basic Metabolic Panel: Recent Labs  Lab 08/24/20 0449 08/27/20 1200 08/28/20 0416  NA 135 136  --   K 4.8 5.2* 5.2*  CL 102 103  --   CO2 26 26  --   GLUCOSE 157* 136*  --   BUN 37* 34*  --   CREATININE 1.03 0.96  --   CALCIUM 8.3* 8.1*  --   MG 2.4  --   --     ABG: No results for input(s): PHART, PCO2ART, PO2ART, HCO3, O2SAT in the last 168 hours.  Liver Function Tests: No results for input(s): AST, ALT, ALKPHOS, BILITOT, PROT, ALBUMIN in the last 168 hours. No results for input(s): LIPASE, AMYLASE in the last 168 hours. No results for input(s): AMMONIA in the last 168 hours.  CBC: Recent Labs  Lab 08/22/20 1633 08/24/20 0449 08/27/20 1200  WBC 7.3 10.5 15.9*  HGB 7.9* 8.1* 9.9*  HCT 25.0* 25.5* 30.9*  MCV 92.9 91.7 93.1  PLT 606* 622* 711*    Cardiac Enzymes: No results for input(s): CKTOTAL, CKMB, CKMBINDEX, TROPONINI in the last 168 hours.  BNP (last 3 results) No results for input(s): BNP in the last  8760 hours.  ProBNP (last 3 results) No results for input(s): PROBNP in the last 8760 hours.  Radiological Exams: DG CHEST PORT 1 VIEW  Result Date: 08/27/2020 CLINICAL DATA:  Elevated WBC EXAM: PORTABLE CHEST 1 VIEW COMPARISON:  08/16/2020 FINDINGS: The heart size and mediastinal contours are within normal limits. Tracheostomy. Bandlike scarring or atelectasis of the right midlung. No new or acute airspace opacity. Redemonstrated minimally displaced oblique fracture of the middle third of the left clavicle as well as multiple lateral left rib fractures. IMPRESSION: 1. No acute abnormality of the lungs in AP portable projection. 2. Tracheostomy. 3. Redemonstrated minimally displaced oblique fracture of the middle third of the left clavicle as well as multiple lateral left rib fractures. Electronically Signed   By: Lauralyn Primes M.D.   On: 08/27/2020 18:24    Assessment/Plan Active Problems:   Acute on chronic respiratory failure with hypoxia (HCC)   Cardiac arrest (HCC)   Chronic anoxic encephalopathy (HCC)   Cerebrovascular accident (CVA) (HCC)   Healthcare-associated pneumonia   Acute on chronic respiratory failure hypoxia continue with the T-piece titrate oxygen as tolerated continue pulmonary toilet. Cardiac arrest rhythm is stable at this time we will continue to monitor closely. Chronic anoxic encephalopathy no change we will continue to follow along closely. Stroke supportive care Healthcare associated pneumonia treated   I have personally seen and  evaluated the patient, evaluated laboratory and imaging results, formulated the assessment and plan and placed orders. The Patient requires high complexity decision making with multiple systems involvement.  Rounds were done with the Respiratory Therapy Director and Staff therapists and discussed with nursing staff also.  Allyne Gee, MD [facility] Pulmonary Critical Care Medicine Sleep Medicine

## 2020-08-29 DIAGNOSIS — G931 Anoxic brain damage, not elsewhere classified: Secondary | ICD-10-CM | POA: Diagnosis not present

## 2020-08-29 DIAGNOSIS — I639 Cerebral infarction, unspecified: Secondary | ICD-10-CM | POA: Diagnosis not present

## 2020-08-29 DIAGNOSIS — I469 Cardiac arrest, cause unspecified: Secondary | ICD-10-CM | POA: Diagnosis not present

## 2020-08-29 DIAGNOSIS — J9621 Acute and chronic respiratory failure with hypoxia: Secondary | ICD-10-CM | POA: Diagnosis not present

## 2020-08-29 LAB — POTASSIUM: Potassium: 5.5 mmol/L — ABNORMAL HIGH (ref 3.5–5.1)

## 2020-08-29 NOTE — Progress Notes (Signed)
Pulmonary Critical Care Medicine [facility] [place]   PULMONARY CRITICAL CARE SERVICE  PROGRESS NOTE     Rick Wilson  AUQ:[id number]  DOB: 26-May-1945   DOA: 08/16/2020  Referring Physician: Carron Curie, MD  HPI: Rick Wilson is a 75 y.o. male being followed for ventilator/airway/oxygen weaning Acute on Chronic Respiratory Failure.  Patient is currently on T collar on room air.  Doing well.  Ready for capping  Medications: Reviewed on Rounds  Physical Exam:  Vitals: Temperature is 97.4 pulse 83 respiratory rate is 24 blood pressure is 168/89 saturations 99%  Ventilator Settings on T collar room air  General: Comfortable at this time Neck: supple Cardiovascular: no malignant arrhythmias Respiratory: No rhonchi very coarse breath sounds Skin: no rash seen on limited exam Musculoskeletal: No gross abnormality Psychiatric:unable to assess Neurologic:no involuntary movements         Lab Data:   Basic Metabolic Panel: Recent Labs  Lab 08/24/20 0449 08/27/20 1200 08/28/20 0416 08/29/20 0307  NA 135 136  --   --   K 4.8 5.2* 5.2* 5.5*  CL 102 103  --   --   CO2 26 26  --   --   GLUCOSE 157* 136*  --   --   BUN 37* 34*  --   --   CREATININE 1.03 0.96  --   --   CALCIUM 8.3* 8.1*  --   --   MG 2.4  --   --   --     ABG: No results for input(s): PHART, PCO2ART, PO2ART, HCO3, O2SAT in the last 168 hours.  Liver Function Tests: No results for input(s): AST, ALT, ALKPHOS, BILITOT, PROT, ALBUMIN in the last 168 hours. No results for input(s): LIPASE, AMYLASE in the last 168 hours. No results for input(s): AMMONIA in the last 168 hours.  CBC: Recent Labs  Lab 08/22/20 1633 08/24/20 0449 08/27/20 1200  WBC 7.3 10.5 15.9*  HGB 7.9* 8.1* 9.9*  HCT 25.0* 25.5* 30.9*  MCV 92.9 91.7 93.1  PLT 606* 622* 711*    Cardiac Enzymes: No results for input(s): CKTOTAL, CKMB, CKMBINDEX, TROPONINI in the last 168 hours.  BNP (last 3 results) No results for  input(s): BNP in the last 8760 hours.  ProBNP (last 3 results) No results for input(s): PROBNP in the last 8760 hours.  Radiological Exams: DG CHEST PORT 1 VIEW  Result Date: 08/27/2020 CLINICAL DATA:  Elevated WBC EXAM: PORTABLE CHEST 1 VIEW COMPARISON:  08/16/2020 FINDINGS: The heart size and mediastinal contours are within normal limits. Tracheostomy. Bandlike scarring or atelectasis of the right midlung. No new or acute airspace opacity. Redemonstrated minimally displaced oblique fracture of the middle third of the left clavicle as well as multiple lateral left rib fractures. IMPRESSION: 1. No acute abnormality of the lungs in AP portable projection. 2. Tracheostomy. 3. Redemonstrated minimally displaced oblique fracture of the middle third of the left clavicle as well as multiple lateral left rib fractures. Electronically Signed   By: Lauralyn Primes M.D.   On: 08/27/2020 18:24    Assessment/Plan Active Problems:   Acute on chronic respiratory failure with hypoxia (HCC)   Cardiac arrest (HCC)   Chronic anoxic encephalopathy (HCC)   Cerebrovascular accident (CVA) (HCC)   Healthcare-associated pneumonia   Acute on chronic respiratory failure hypoxia continue with T-piece titrate oxygen continue pulmonary toilet.  Today we will try to start capping trial Cardiac arrest rhythm is stable supportive care Chronic anoxic encephalopathy at baseline right now  no improvement no change Stroke again baseline therapy if tolerated Healthcare associated pneumonia improving   I have personally seen and evaluated the patient, evaluated laboratory and imaging results, formulated the assessment and plan and placed orders. The Patient requires high complexity decision making with multiple systems involvement.  Rounds were done with the Respiratory Therapy Director and Staff therapists and discussed with nursing staff also.  Yevonne Pax, MD [facility] Pulmonary Critical Care Medicine Sleep Medicine

## 2020-08-30 ENCOUNTER — Other Ambulatory Visit (HOSPITAL_COMMUNITY): Payer: Medicare Other

## 2020-08-30 DIAGNOSIS — G931 Anoxic brain damage, not elsewhere classified: Secondary | ICD-10-CM | POA: Diagnosis not present

## 2020-08-30 DIAGNOSIS — I639 Cerebral infarction, unspecified: Secondary | ICD-10-CM | POA: Diagnosis not present

## 2020-08-30 DIAGNOSIS — I469 Cardiac arrest, cause unspecified: Secondary | ICD-10-CM | POA: Diagnosis not present

## 2020-08-30 DIAGNOSIS — J9621 Acute and chronic respiratory failure with hypoxia: Secondary | ICD-10-CM | POA: Diagnosis not present

## 2020-08-30 LAB — URINALYSIS, ROUTINE W REFLEX MICROSCOPIC
Bilirubin Urine: NEGATIVE
Glucose, UA: NEGATIVE mg/dL
Hgb urine dipstick: NEGATIVE
Ketones, ur: NEGATIVE mg/dL
Leukocytes,Ua: NEGATIVE
Nitrite: NEGATIVE
Protein, ur: NEGATIVE mg/dL
Specific Gravity, Urine: 1.015 (ref 1.005–1.030)
pH: 6 (ref 5.0–8.0)

## 2020-08-30 LAB — CBC
HCT: 33.1 % — ABNORMAL LOW (ref 39.0–52.0)
Hemoglobin: 10.6 g/dL — ABNORMAL LOW (ref 13.0–17.0)
MCH: 30 pg (ref 26.0–34.0)
MCHC: 32 g/dL (ref 30.0–36.0)
MCV: 93.8 fL (ref 80.0–100.0)
Platelets: 712 10*3/uL — ABNORMAL HIGH (ref 150–400)
RBC: 3.53 MIL/uL — ABNORMAL LOW (ref 4.22–5.81)
RDW: 15.4 % (ref 11.5–15.5)
WBC: 19.3 10*3/uL — ABNORMAL HIGH (ref 4.0–10.5)
nRBC: 0.7 % — ABNORMAL HIGH (ref 0.0–0.2)

## 2020-08-30 LAB — BASIC METABOLIC PANEL
Anion gap: 10 (ref 5–15)
BUN: 35 mg/dL — ABNORMAL HIGH (ref 8–23)
CO2: 26 mmol/L (ref 22–32)
Calcium: 8.6 mg/dL — ABNORMAL LOW (ref 8.9–10.3)
Chloride: 98 mmol/L (ref 98–111)
Creatinine, Ser: 0.99 mg/dL (ref 0.61–1.24)
GFR, Estimated: >60 mL/min (ref >60)
Glucose, Bld: 164 mg/dL — ABNORMAL HIGH (ref 70–99)
Potassium: 4.6 mmol/L (ref 3.5–5.1)
Sodium: 134 mmol/L — ABNORMAL LOW (ref 135–145)

## 2020-08-30 NOTE — Progress Notes (Signed)
Pulmonary Critical Care Medicine [facility] [place]   PULMONARY CRITICAL CARE SERVICE  PROGRESS NOTE     Rick Wilson  [id number]:[id number]  DOB: 03-Jul-1945   DOA: 08/16/2020  Referring Physician: Carron Curie, MD  HPI: [name] is a 75 y.o. male being followed for ventilator/airway/oxygen weaning Acute on Chronic Respiratory Failure.  Right now is afebrile without distress has been doing fine with capping trials completing 24 hours  Medications: Reviewed on Rounds  Physical Exam:  Vitals: Temperature is 97.6 pulse 81 respiratory 29 blood pressure is 187/86 saturations 96%  Ventilator Settings capping on 24-hour goal  General: Comfortable at this time Neck: supple Cardiovascular: no malignant arrhythmias Respiratory: No rhonchi no rales noted at this time Skin: no rash seen on limited exam Musculoskeletal: No gross abnormality Psychiatric:unable to assess Neurologic:no involuntary movements         Lab Data:   Basic Metabolic Panel: Recent Labs  Lab 08/24/20 0449 08/27/20 1200 08/28/20 0416 08/29/20 0307 08/30/20 0345  NA 135 136  --   --  134*  K 4.8 5.2* 5.2* 5.5* 4.6  CL 102 103  --   --  98  CO2 26 26  --   --  26  GLUCOSE 157* 136*  --   --  164*  BUN 37* 34*  --   --  35*  CREATININE 1.03 0.96  --   --  0.99  CALCIUM 8.3* 8.1*  --   --  8.6*  MG 2.4  --   --   --   --     ABG: No results for input(s): PHART, PCO2ART, PO2ART, HCO3, O2SAT in the last 168 hours.  Liver Function Tests: No results for input(s): AST, ALT, ALKPHOS, BILITOT, PROT, ALBUMIN in the last 168 hours. No results for input(s): LIPASE, AMYLASE in the last 168 hours. No results for input(s): AMMONIA in the last 168 hours.  CBC: Recent Labs  Lab 08/24/20 0449 08/27/20 1200 08/30/20 0345  WBC 10.5 15.9* 19.3*  HGB 8.1* 9.9* 10.6*  HCT 25.5* 30.9* 33.1*  MCV 91.7 93.1 93.8  PLT 622* 711* 712*    Cardiac Enzymes: No results for input(s): CKTOTAL, CKMB,  CKMBINDEX, TROPONINI in the last 168 hours.  BNP (last 3 results) No results for input(s): BNP in the last 8760 hours.  ProBNP (last 3 results) No results for input(s): PROBNP in the last 8760 hours.  Radiological Exams: No results found.  Assessment/Plan Active Problems:   Acute on chronic respiratory failure with hypoxia (HCC)   Cardiac arrest (HCC)   Chronic anoxic encephalopathy (HCC)   Cerebrovascular accident (CVA) (HCC)   Healthcare-associated pneumonia   Acute on chronic respiratory failure hypoxia continue with capping completing 24 hours today Cardiac arrest rhythm has been stable we will continue to monitor Chronic anoxic encephalopathy overall no changes noted CVA supportive care Healthcare associated pneumonia treated we will continue to monitor   I have personally seen and evaluated the patient, evaluated laboratory and imaging results, formulated the assessment and plan and placed orders. The Patient requires high complexity decision making with multiple systems involvement.  Rounds were done with the Respiratory Therapy Director and Staff therapists and discussed with nursing staff also.  Yevonne Pax, MD [facility] Pulmonary Critical Care Medicine Sleep Medicine

## 2020-08-31 DIAGNOSIS — J9621 Acute and chronic respiratory failure with hypoxia: Secondary | ICD-10-CM | POA: Diagnosis not present

## 2020-08-31 DIAGNOSIS — G931 Anoxic brain damage, not elsewhere classified: Secondary | ICD-10-CM | POA: Diagnosis not present

## 2020-08-31 DIAGNOSIS — I639 Cerebral infarction, unspecified: Secondary | ICD-10-CM | POA: Diagnosis not present

## 2020-08-31 DIAGNOSIS — I469 Cardiac arrest, cause unspecified: Secondary | ICD-10-CM | POA: Diagnosis not present

## 2020-08-31 NOTE — Progress Notes (Signed)
Pulmonary Critical Care Medicine [facility] [place]   PULMONARY CRITICAL CARE SERVICE  PROGRESS NOTE     Hadrian Yarbrough  VQQ:[id number]  DOB: September 17, 1945   DOA: 08/16/2020  Referring Physician: Carron Curie, MD  HPI: Jacorey Donaway is a 75 y.o. male being followed for ventilator/airway/oxygen weaning Acute on Chronic Respiratory Failure.  Patient is comfortable right now without distress afebrile has been capping on room air  Medications: Reviewed on Rounds  Physical Exam:  Vitals: Temperature is 96.8 pulse 95 respiratory rate is 21 blood pressure 147/94 saturations 100%  Ventilator Settings capping currently on room air  General: Comfortable at this time Neck: supple Cardiovascular: no malignant arrhythmias Respiratory: No rhonchi very coarse breath Skin: no rash seen on limited exam Musculoskeletal: No gross abnormality Psychiatric:unable to assess Neurologic:no involuntary movements         Lab Data:   Basic Metabolic Panel: Recent Labs  Lab 08/27/20 1200 08/28/20 0416 08/29/20 0307 08/30/20 0345  NA 136  --   --  134*  K 5.2* 5.2* 5.5* 4.6  CL 103  --   --  98  CO2 26  --   --  26  GLUCOSE 136*  --   --  164*  BUN 34*  --   --  35*  CREATININE 0.96  --   --  0.99  CALCIUM 8.1*  --   --  8.6*    ABG: No results for input(s): PHART, PCO2ART, PO2ART, HCO3, O2SAT in the last 168 hours.  Liver Function Tests: No results for input(s): AST, ALT, ALKPHOS, BILITOT, PROT, ALBUMIN in the last 168 hours. No results for input(s): LIPASE, AMYLASE in the last 168 hours. No results for input(s): AMMONIA in the last 168 hours.  CBC: Recent Labs  Lab 08/27/20 1200 08/30/20 0345  WBC 15.9* 19.3*  HGB 9.9* 10.6*  HCT 30.9* 33.1*  MCV 93.1 93.8  PLT 711* 712*    Cardiac Enzymes: No results for input(s): CKTOTAL, CKMB, CKMBINDEX, TROPONINI in the last 168 hours.  BNP (last 3 results) No results for input(s): BNP in the last 8760 hours.  ProBNP  (last 3 results) No results for input(s): PROBNP in the last 8760 hours.  Radiological Exams: DG Chest Port 1 View  Result Date: 08/30/2020 CLINICAL DATA:  76 year old male with concern for pneumonia. EXAM: PORTABLE CHEST 1 VIEW COMPARISON:  Chest radiograph dated 08/27/2020. FINDINGS: Tracheostomy with tip approximately 5.5 cm above the carina. No focal consolidation, pleural effusion, pneumothorax. Borderline cardiomegaly. No acute osseous pathology. Partially visualized left humeral fixation sideplate. Fracture of the left clavicle again noted. IMPRESSION: No acute cardiopulmonary process. Electronically Signed   By: Elgie Collard M.D.   On: 08/30/2020 15:56    Assessment/Plan Active Problems:   Acute on chronic respiratory failure with hypoxia (HCC)   Cardiac arrest (HCC)   Chronic anoxic encephalopathy (HCC)   Cerebrovascular accident (CVA) (HCC)   Healthcare-associated pneumonia   Acute on chronic respiratory failure hypoxia we will continue with capping trials patient will be completing 72 hours Cardiac arrest rhythm is stable at this time we will continue to monitor closely. Chronic anoxic encephalopathy overall no change Stroke supportive care Healthcare associated pneumonia has been treated with antibiotics   I have personally seen and evaluated the patient, evaluated laboratory and imaging results, formulated the assessment and plan and placed orders. The Patient requires high complexity decision making with multiple systems involvement.  Rounds were done with the Respiratory Therapy Director and Staff therapists and  discussed with nursing staff also.  Allyne Gee, MD [facility] Pulmonary Critical Care Medicine Sleep Medicine

## 2020-08-31 NOTE — Progress Notes (Signed)
Rick Wilson is a 75 y.o. male s/p image guided gastrostomy tube placement with Dr. Elby Showers on 7/21.   G tube site appears clean, dry, no bleeding, no sign of acute infection.  T-tacks removed at bedside.   Bumper was not cinched to skin, was pulled about 1 inch from the skin. Bumper cinched to skin with no problem. Keep bumper cinched to skin at all time to prevent leakage.  Please call IR for questions and concerns regarding the G tube.   [name] Muranda Coye PA-C 08/31/2020 11:23 AM

## 2020-09-01 LAB — URINE CULTURE
Culture: NO GROWTH
Special Requests: NORMAL

## 2020-09-02 DIAGNOSIS — I639 Cerebral infarction, unspecified: Secondary | ICD-10-CM | POA: Diagnosis not present

## 2020-09-02 DIAGNOSIS — G931 Anoxic brain damage, not elsewhere classified: Secondary | ICD-10-CM | POA: Diagnosis not present

## 2020-09-02 DIAGNOSIS — I469 Cardiac arrest, cause unspecified: Secondary | ICD-10-CM | POA: Diagnosis not present

## 2020-09-02 DIAGNOSIS — J9621 Acute and chronic respiratory failure with hypoxia: Secondary | ICD-10-CM | POA: Diagnosis not present

## 2020-09-02 LAB — CBC
HCT: 38 % — ABNORMAL LOW (ref 39.0–52.0)
Hemoglobin: 12.3 g/dL — ABNORMAL LOW (ref 13.0–17.0)
MCH: 30.1 pg (ref 26.0–34.0)
MCHC: 32.4 g/dL (ref 30.0–36.0)
MCV: 93.1 fL (ref 80.0–100.0)
Platelets: 521 10*3/uL — ABNORMAL HIGH (ref 150–400)
RBC: 4.08 MIL/uL — ABNORMAL LOW (ref 4.22–5.81)
RDW: 16.7 % — ABNORMAL HIGH (ref 11.5–15.5)
WBC: 18.5 10*3/uL — ABNORMAL HIGH (ref 4.0–10.5)
nRBC: 0.3 % — ABNORMAL HIGH (ref 0.0–0.2)

## 2020-09-02 LAB — BASIC METABOLIC PANEL
Anion gap: 9 (ref 5–15)
BUN: 46 mg/dL — ABNORMAL HIGH (ref 8–23)
CO2: 28 mmol/L (ref 22–32)
Calcium: 9 mg/dL (ref 8.9–10.3)
Chloride: 96 mmol/L — ABNORMAL LOW (ref 98–111)
Creatinine, Ser: 1.06 mg/dL (ref 0.61–1.24)
GFR, Estimated: >60 mL/min (ref >60)
Glucose, Bld: 122 mg/dL — ABNORMAL HIGH (ref 70–99)
Potassium: 4.4 mmol/L (ref 3.5–5.1)
Sodium: 133 mmol/L — ABNORMAL LOW (ref 135–145)

## 2020-09-02 NOTE — Progress Notes (Signed)
Pulmonary Critical Care Medicine [facility] [place]   PULMONARY CRITICAL CARE SERVICE  PROGRESS NOTE     Rick Wilson  KXF:[id number]  DOB: 04/21/1945   DOA: 08/16/2020  Referring Physician: Carron Curie, MD  HPI: Rick Wilson is a 75 y.o. male being followed for ventilator/airway/oxygen weaning Acute on Chronic Respiratory Failure.  Patient is capping completing 4 hours of capping trials has been doing very well ready for decannulation  Medications: Reviewed on Rounds  Physical Exam:  Vitals: Temperature is 98.0 pulse 89 respiratory 22 blood pressure is 162/95 saturations 100%  Ventilator Settings capping on room air  General: Comfortable at this time Neck: supple Cardiovascular: no malignant arrhythmias Respiratory: No rhonchi no rales are noted at this time Skin: no rash seen on limited exam Musculoskeletal: No gross abnormality Psychiatric:unable to assess Neurologic:no involuntary movements         Lab Data:   Basic Metabolic Panel: Recent Labs  Lab 08/27/20 1200 08/28/20 0416 08/29/20 0307 08/30/20 0345 09/02/20 0338  NA 136  --   --  134* 133*  K 5.2* 5.2* 5.5* 4.6 4.4  CL 103  --   --  98 96*  CO2 26  --   --  26 28  GLUCOSE 136*  --   --  164* 122*  BUN 34*  --   --  35* 46*  CREATININE 0.96  --   --  0.99 1.06  CALCIUM 8.1*  --   --  8.6* 9.0    ABG: No results for input(s): PHART, PCO2ART, PO2ART, HCO3, O2SAT in the last 168 hours.  Liver Function Tests: No results for input(s): AST, ALT, ALKPHOS, BILITOT, PROT, ALBUMIN in the last 168 hours. No results for input(s): LIPASE, AMYLASE in the last 168 hours. No results for input(s): AMMONIA in the last 168 hours.  CBC: Recent Labs  Lab 08/27/20 1200 08/30/20 0345 09/02/20 0338  WBC 15.9* 19.3* 18.5*  HGB 9.9* 10.6* 12.3*  HCT 30.9* 33.1* 38.0*  MCV 93.1 93.8 93.1  PLT 711* 712* 521*    Cardiac Enzymes: No results for input(s): CKTOTAL, CKMB, CKMBINDEX, TROPONINI in the  last 168 hours.  BNP (last 3 results) No results for input(s): BNP in the last 8760 hours.  ProBNP (last 3 results) No results for input(s): PROBNP in the last 8760 hours.  Radiological Exams: No results found.  Assessment/Plan Active Problems:   Acute on chronic respiratory failure with hypoxia (HCC)   Cardiac arrest (HCC)   Chronic anoxic encephalopathy (HCC)   Cerebrovascular accident (CVA) (HCC)   Healthcare-associated pneumonia   Acute on chronic respiratory failure hypoxia we will proceed to decannulate today patient is done extremely well with capping trials. Cardiac arrest rhythm has been stable we will continue to monitor Chronic anoxic encephalopathy no change we will continue to follow along closely. CVA no change supportive care Healthcare associated pneumonia treated we will continue to monitor.   I have personally seen and evaluated the patient, evaluated laboratory and imaging results, formulated the assessment and plan and placed orders. The Patient requires high complexity decision making with multiple systems involvement.  Rounds were done with the Respiratory Therapy Director and Staff therapists and discussed with nursing staff also.  Yevonne Pax, MD [facility] Pulmonary Critical Care Medicine Sleep Medicine

## 2020-09-04 LAB — CBC
HCT: 34.7 % — ABNORMAL LOW (ref 39.0–52.0)
Hemoglobin: 11.7 g/dL — ABNORMAL LOW (ref 13.0–17.0)
MCH: 30.7 pg (ref 26.0–34.0)
MCHC: 33.7 g/dL (ref 30.0–36.0)
MCV: 91.1 fL (ref 80.0–100.0)
Platelets: 535 10*3/uL — ABNORMAL HIGH (ref 150–400)
RBC: 3.81 MIL/uL — ABNORMAL LOW (ref 4.22–5.81)
RDW: 16.7 % — ABNORMAL HIGH (ref 11.5–15.5)
WBC: 14.9 10*3/uL — ABNORMAL HIGH (ref 4.0–10.5)
nRBC: 0.1 % (ref 0.0–0.2)

## 2020-09-07 LAB — BASIC METABOLIC PANEL
Anion gap: 13 (ref 5–15)
BUN: 56 mg/dL — ABNORMAL HIGH (ref 8–23)
CO2: 27 mmol/L (ref 22–32)
Calcium: 9.3 mg/dL (ref 8.9–10.3)
Chloride: 99 mmol/L (ref 98–111)
Creatinine, Ser: 1.2 mg/dL (ref 0.61–1.24)
GFR, Estimated: >60 mL/min (ref >60)
Glucose, Bld: 119 mg/dL — ABNORMAL HIGH (ref 70–99)
Potassium: 3.5 mmol/L (ref 3.5–5.1)
Sodium: 139 mmol/L (ref 135–145)

## 2020-09-07 LAB — CBC
HCT: 38.8 % — ABNORMAL LOW (ref 39.0–52.0)
Hemoglobin: 12.4 g/dL — ABNORMAL LOW (ref 13.0–17.0)
MCH: 30 pg (ref 26.0–34.0)
MCHC: 32 g/dL (ref 30.0–36.0)
MCV: 93.9 fL (ref 80.0–100.0)
Platelets: 340 10*3/uL (ref 150–400)
RBC: 4.13 MIL/uL — ABNORMAL LOW (ref 4.22–5.81)
RDW: 16.8 % — ABNORMAL HIGH (ref 11.5–15.5)
WBC: 12.3 10*3/uL — ABNORMAL HIGH (ref 4.0–10.5)
nRBC: 0 % (ref 0.0–0.2)

## 2020-09-07 LAB — MAGNESIUM
Magnesium: 2.4 mg/dL (ref 1.7–2.4)
Magnesium: 2.4 mg/dL (ref 1.7–2.4)

## 2020-09-09 LAB — BASIC METABOLIC PANEL
Anion gap: 10 (ref 5–15)
BUN: 60 mg/dL — ABNORMAL HIGH (ref 8–23)
CO2: 29 mmol/L (ref 22–32)
Calcium: 9.4 mg/dL (ref 8.9–10.3)
Chloride: 104 mmol/L (ref 98–111)
Creatinine, Ser: 1.17 mg/dL (ref 0.61–1.24)
GFR, Estimated: >60 mL/min (ref >60)
Glucose, Bld: 132 mg/dL — ABNORMAL HIGH (ref 70–99)
Potassium: 3.4 mmol/L — ABNORMAL LOW (ref 3.5–5.1)
Sodium: 143 mmol/L (ref 135–145)

## 2020-09-09 LAB — CBC
HCT: 37.8 % — ABNORMAL LOW (ref 39.0–52.0)
Hemoglobin: 12.3 g/dL — ABNORMAL LOW (ref 13.0–17.0)
MCH: 30.4 pg (ref 26.0–34.0)
MCHC: 32.5 g/dL (ref 30.0–36.0)
MCV: 93.6 fL (ref 80.0–100.0)
Platelets: 285 10*3/uL (ref 150–400)
RBC: 4.04 MIL/uL — ABNORMAL LOW (ref 4.22–5.81)
RDW: 16.6 % — ABNORMAL HIGH (ref 11.5–15.5)
WBC: 14.7 10*3/uL — ABNORMAL HIGH (ref 4.0–10.5)
nRBC: 0 % (ref 0.0–0.2)

## 2020-09-09 LAB — MAGNESIUM: Magnesium: 2.4 mg/dL (ref 1.7–2.4)

## 2020-09-10 LAB — BASIC METABOLIC PANEL
Anion gap: 10 (ref 5–15)
BUN: 51 mg/dL — ABNORMAL HIGH (ref 8–23)
CO2: 28 mmol/L (ref 22–32)
Calcium: 9.5 mg/dL (ref 8.9–10.3)
Chloride: 106 mmol/L (ref 98–111)
Creatinine, Ser: 1.1 mg/dL (ref 0.61–1.24)
GFR, Estimated: >60 mL/min (ref >60)
Glucose, Bld: 114 mg/dL — ABNORMAL HIGH (ref 70–99)
Potassium: 4.2 mmol/L (ref 3.5–5.1)
Sodium: 144 mmol/L (ref 135–145)

## 2020-09-12 ENCOUNTER — Other Ambulatory Visit (HOSPITAL_COMMUNITY): Payer: Medicare Other

## 2020-09-13 ENCOUNTER — Other Ambulatory Visit (HOSPITAL_COMMUNITY): Payer: Medicare Other

## 2020-09-13 LAB — BASIC METABOLIC PANEL
Anion gap: 11 (ref 5–15)
BUN: 38 mg/dL — ABNORMAL HIGH (ref 8–23)
CO2: 26 mmol/L (ref 22–32)
Calcium: 8.9 mg/dL (ref 8.9–10.3)
Chloride: 110 mmol/L (ref 98–111)
Creatinine, Ser: 1.25 mg/dL — ABNORMAL HIGH (ref 0.61–1.24)
GFR, Estimated: >60 mL/min (ref >60)
Glucose, Bld: 100 mg/dL — ABNORMAL HIGH (ref 70–99)
Potassium: 4.5 mmol/L (ref 3.5–5.1)
Sodium: 147 mmol/L — ABNORMAL HIGH (ref 135–145)

## 2020-09-13 LAB — CBC
HCT: 39.1 % (ref 39.0–52.0)
Hemoglobin: 12.1 g/dL — ABNORMAL LOW (ref 13.0–17.0)
MCH: 30.2 pg (ref 26.0–34.0)
MCHC: 30.9 g/dL (ref 30.0–36.0)
MCV: 97.5 fL (ref 80.0–100.0)
Platelets: 184 10*3/uL (ref 150–400)
RBC: 4.01 MIL/uL — ABNORMAL LOW (ref 4.22–5.81)
RDW: 16.9 % — ABNORMAL HIGH (ref 11.5–15.5)
WBC: 10.6 10*3/uL — ABNORMAL HIGH (ref 4.0–10.5)
nRBC: 0 % (ref 0.0–0.2)

## 2020-09-14 LAB — BASIC METABOLIC PANEL
Anion gap: 7 (ref 5–15)
BUN: 27 mg/dL — ABNORMAL HIGH (ref 8–23)
CO2: 28 mmol/L (ref 22–32)
Calcium: 8.8 mg/dL — ABNORMAL LOW (ref 8.9–10.3)
Chloride: 106 mmol/L (ref 98–111)
Creatinine, Ser: 0.92 mg/dL (ref 0.61–1.24)
GFR, Estimated: >60 mL/min (ref >60)
Glucose, Bld: 94 mg/dL (ref 70–99)
Potassium: 3.9 mmol/L (ref 3.5–5.1)
Sodium: 141 mmol/L (ref 135–145)

## 2020-09-18 ENCOUNTER — Other Ambulatory Visit (HOSPITAL_COMMUNITY): Payer: Medicare Other

## 2020-09-19 ENCOUNTER — Encounter (HOSPITAL_BASED_OUTPATIENT_CLINIC_OR_DEPARTMENT_OTHER): Payer: Medicare Other

## 2020-09-19 DIAGNOSIS — R609 Edema, unspecified: Secondary | ICD-10-CM

## 2020-09-19 LAB — MAGNESIUM: Magnesium: 1.8 mg/dL (ref 1.7–2.4)

## 2020-09-19 LAB — POTASSIUM: Potassium: 3.5 mmol/L (ref 3.5–5.1)

## 2020-09-19 NOTE — Progress Notes (Signed)
Lower extremity venous has been completed.   Preliminary results in CV Proc.   Blanch Media 09/19/2020 2:54 PM

## 2020-09-20 ENCOUNTER — Other Ambulatory Visit (HOSPITAL_COMMUNITY): Payer: Medicare Other

## 2020-09-21 ENCOUNTER — Other Ambulatory Visit (HOSPITAL_COMMUNITY): Payer: Medicare Other

## 2020-09-21 LAB — BASIC METABOLIC PANEL
Anion gap: 7 (ref 5–15)
BUN: 7 mg/dL — ABNORMAL LOW (ref 8–23)
CO2: 25 mmol/L (ref 22–32)
Calcium: 8.8 mg/dL — ABNORMAL LOW (ref 8.9–10.3)
Chloride: 103 mmol/L (ref 98–111)
Creatinine, Ser: 0.55 mg/dL — ABNORMAL LOW (ref 0.61–1.24)
GFR, Estimated: >60 mL/min (ref >60)
Glucose, Bld: 99 mg/dL (ref 70–99)
Potassium: 3.6 mmol/L (ref 3.5–5.1)
Sodium: 135 mmol/L (ref 135–145)

## 2020-09-21 LAB — CBC
HCT: 35.4 % — ABNORMAL LOW (ref 39.0–52.0)
Hemoglobin: 11.9 g/dL — ABNORMAL LOW (ref 13.0–17.0)
MCH: 30.7 pg (ref 26.0–34.0)
MCHC: 33.6 g/dL (ref 30.0–36.0)
MCV: 91.5 fL (ref 80.0–100.0)
Platelets: 265 10*3/uL (ref 150–400)
RBC: 3.87 MIL/uL — ABNORMAL LOW (ref 4.22–5.81)
RDW: 15.9 % — ABNORMAL HIGH (ref 11.5–15.5)
WBC: 9.2 10*3/uL (ref 4.0–10.5)
nRBC: 0 % (ref 0.0–0.2)

## 2020-09-25 LAB — BASIC METABOLIC PANEL
Anion gap: 6 (ref 5–15)
BUN: 5 mg/dL — ABNORMAL LOW (ref 8–23)
CO2: 25 mmol/L (ref 22–32)
Calcium: 8.7 mg/dL — ABNORMAL LOW (ref 8.9–10.3)
Chloride: 103 mmol/L (ref 98–111)
Creatinine, Ser: 0.66 mg/dL (ref 0.61–1.24)
GFR, Estimated: >60 mL/min (ref >60)
Glucose, Bld: 103 mg/dL — ABNORMAL HIGH (ref 70–99)
Potassium: 3.5 mmol/L (ref 3.5–5.1)
Sodium: 134 mmol/L — ABNORMAL LOW (ref 135–145)

## 2020-09-25 LAB — CBC
HCT: 32.5 % — ABNORMAL LOW (ref 39.0–52.0)
Hemoglobin: 11 g/dL — ABNORMAL LOW (ref 13.0–17.0)
MCH: 30.9 pg (ref 26.0–34.0)
MCHC: 33.8 g/dL (ref 30.0–36.0)
MCV: 91.3 fL (ref 80.0–100.0)
Platelets: 351 10*3/uL (ref 150–400)
RBC: 3.56 MIL/uL — ABNORMAL LOW (ref 4.22–5.81)
RDW: 15.6 % — ABNORMAL HIGH (ref 11.5–15.5)
WBC: 8 10*3/uL (ref 4.0–10.5)
nRBC: 0 % (ref 0.0–0.2)

## 2020-09-26 ENCOUNTER — Other Ambulatory Visit (HOSPITAL_COMMUNITY): Payer: Medicare Other

## 2020-09-27 ENCOUNTER — Other Ambulatory Visit (HOSPITAL_COMMUNITY): Payer: Medicare Other

## 2020-10-01 ENCOUNTER — Other Ambulatory Visit (HOSPITAL_COMMUNITY): Payer: Medicare Other

## 2020-10-02 LAB — CBC
HCT: 32.8 % — ABNORMAL LOW (ref 39.0–52.0)
Hemoglobin: 11.2 g/dL — ABNORMAL LOW (ref 13.0–17.0)
MCH: 31 pg (ref 26.0–34.0)
MCHC: 34.1 g/dL (ref 30.0–36.0)
MCV: 90.9 fL (ref 80.0–100.0)
Platelets: 380 10*3/uL (ref 150–400)
RBC: 3.61 MIL/uL — ABNORMAL LOW (ref 4.22–5.81)
RDW: 15.3 % (ref 11.5–15.5)
WBC: 9.7 10*3/uL (ref 4.0–10.5)
nRBC: 0 % (ref 0.0–0.2)

## 2020-10-02 LAB — BASIC METABOLIC PANEL
Anion gap: 6 (ref 5–15)
BUN: 5 mg/dL — ABNORMAL LOW (ref 8–23)
CO2: 26 mmol/L (ref 22–32)
Calcium: 8.7 mg/dL — ABNORMAL LOW (ref 8.9–10.3)
Chloride: 103 mmol/L (ref 98–111)
Creatinine, Ser: 0.59 mg/dL — ABNORMAL LOW (ref 0.61–1.24)
GFR, Estimated: >60 mL/min (ref >60)
Glucose, Bld: 95 mg/dL (ref 70–99)
Potassium: 3 mmol/L — ABNORMAL LOW (ref 3.5–5.1)
Sodium: 135 mmol/L (ref 135–145)

## 2020-10-02 LAB — SARS CORONAVIRUS 2 (TAT 6-24 HRS): SARS Coronavirus 2: NEGATIVE

## 2020-10-02 LAB — MAGNESIUM: Magnesium: 1.3 mg/dL — ABNORMAL LOW (ref 1.7–2.4)

## 2020-10-03 ENCOUNTER — Other Ambulatory Visit (HOSPITAL_COMMUNITY): Payer: Medicare Other

## 2020-10-03 LAB — MAGNESIUM: Magnesium: 2.1 mg/dL (ref 1.7–2.4)

## 2020-10-03 LAB — POTASSIUM: Potassium: 3.8 mmol/L (ref 3.5–5.1)

## 2020-10-03 MED ORDER — IOHEXOL 350 MG/ML SOLN
100.0000 mL | Freq: Once | INTRAVENOUS | Status: AC | PRN
  Administered 2020-10-03: 100 mL via INTRAVENOUS

## 2020-10-04 ENCOUNTER — Other Ambulatory Visit (HOSPITAL_COMMUNITY): Payer: Medicare Other

## 2020-10-05 ENCOUNTER — Other Ambulatory Visit (HOSPITAL_COMMUNITY): Payer: Medicare Other

## 2020-10-05 LAB — BASIC METABOLIC PANEL
Anion gap: 8 (ref 5–15)
BUN: 5 mg/dL — ABNORMAL LOW (ref 8–23)
CO2: 25 mmol/L (ref 22–32)
Calcium: 8.9 mg/dL (ref 8.9–10.3)
Chloride: 103 mmol/L (ref 98–111)
Creatinine, Ser: 0.64 mg/dL (ref 0.61–1.24)
GFR, Estimated: >60 mL/min (ref >60)
Glucose, Bld: 98 mg/dL (ref 70–99)
Potassium: 3.5 mmol/L (ref 3.5–5.1)
Sodium: 136 mmol/L (ref 135–145)

## 2020-10-06 ENCOUNTER — Other Ambulatory Visit (HOSPITAL_COMMUNITY): Payer: Medicare Other

## 2020-10-08 ENCOUNTER — Other Ambulatory Visit (HOSPITAL_COMMUNITY): Payer: Medicare Other

## 2020-10-09 ENCOUNTER — Other Ambulatory Visit (HOSPITAL_COMMUNITY): Payer: Medicare Other

## 2020-10-09 LAB — BASIC METABOLIC PANEL
Anion gap: 7 (ref 5–15)
BUN: 5 mg/dL — ABNORMAL LOW (ref 8–23)
CO2: 26 mmol/L (ref 22–32)
Calcium: 8.9 mg/dL (ref 8.9–10.3)
Chloride: 104 mmol/L (ref 98–111)
Creatinine, Ser: 0.62 mg/dL (ref 0.61–1.24)
GFR, Estimated: >60 mL/min (ref >60)
Glucose, Bld: 112 mg/dL — ABNORMAL HIGH (ref 70–99)
Potassium: 3 mmol/L — ABNORMAL LOW (ref 3.5–5.1)
Sodium: 137 mmol/L (ref 135–145)

## 2020-10-09 LAB — CBC
HCT: 36.9 % — ABNORMAL LOW (ref 39.0–52.0)
Hemoglobin: 12.2 g/dL — ABNORMAL LOW (ref 13.0–17.0)
MCH: 30.4 pg (ref 26.0–34.0)
MCHC: 33.1 g/dL (ref 30.0–36.0)
MCV: 92 fL (ref 80.0–100.0)
Platelets: 345 10*3/uL (ref 150–400)
RBC: 4.01 MIL/uL — ABNORMAL LOW (ref 4.22–5.81)
RDW: 14.8 % (ref 11.5–15.5)
WBC: 8.2 10*3/uL (ref 4.0–10.5)
nRBC: 0 % (ref 0.0–0.2)

## 2020-10-10 ENCOUNTER — Other Ambulatory Visit (HOSPITAL_COMMUNITY): Payer: Medicare Other

## 2020-10-10 LAB — POTASSIUM: Potassium: 3.6 mmol/L (ref 3.5–5.1)

## 2020-10-11 ENCOUNTER — Other Ambulatory Visit (HOSPITAL_COMMUNITY): Payer: Medicare Other

## 2020-10-12 ENCOUNTER — Other Ambulatory Visit (HOSPITAL_COMMUNITY): Payer: Medicare Other

## 2020-10-12 LAB — BASIC METABOLIC PANEL
Anion gap: 6 (ref 5–15)
BUN: <5 mg/dL — ABNORMAL LOW (ref 8–23)
CO2: 26 mmol/L (ref 22–32)
Calcium: 8.6 mg/dL — ABNORMAL LOW (ref 8.9–10.3)
Chloride: 102 mmol/L (ref 98–111)
Creatinine, Ser: 0.53 mg/dL — ABNORMAL LOW (ref 0.61–1.24)
GFR, Estimated: >60 mL/min (ref >60)
Glucose, Bld: 97 mg/dL (ref 70–99)
Potassium: 3 mmol/L — ABNORMAL LOW (ref 3.5–5.1)
Sodium: 134 mmol/L — ABNORMAL LOW (ref 135–145)

## 2020-10-13 ENCOUNTER — Other Ambulatory Visit (HOSPITAL_COMMUNITY): Payer: Medicare Other

## 2020-10-13 LAB — POTASSIUM: Potassium: 3.7 mmol/L (ref 3.5–5.1)

## 2020-10-14 ENCOUNTER — Other Ambulatory Visit (HOSPITAL_COMMUNITY): Payer: Medicare Other

## 2020-10-14 LAB — BASIC METABOLIC PANEL
Anion gap: 7 (ref 5–15)
BUN: <5 mg/dL — ABNORMAL LOW (ref 8–23)
CO2: 24 mmol/L (ref 22–32)
Calcium: 8.8 mg/dL — ABNORMAL LOW (ref 8.9–10.3)
Chloride: 103 mmol/L (ref 98–111)
Creatinine, Ser: 0.52 mg/dL — ABNORMAL LOW (ref 0.61–1.24)
GFR, Estimated: >60 mL/min (ref >60)
Glucose, Bld: 98 mg/dL (ref 70–99)
Potassium: 3.4 mmol/L — ABNORMAL LOW (ref 3.5–5.1)
Sodium: 134 mmol/L — ABNORMAL LOW (ref 135–145)

## 2020-10-14 LAB — CBC
HCT: 33.4 % — ABNORMAL LOW (ref 39.0–52.0)
Hemoglobin: 11.1 g/dL — ABNORMAL LOW (ref 13.0–17.0)
MCH: 29.9 pg (ref 26.0–34.0)
MCHC: 33.2 g/dL (ref 30.0–36.0)
MCV: 90 fL (ref 80.0–100.0)
Platelets: 275 10*3/uL (ref 150–400)
RBC: 3.71 MIL/uL — ABNORMAL LOW (ref 4.22–5.81)
RDW: 14.7 % (ref 11.5–15.5)
WBC: 10.6 10*3/uL — ABNORMAL HIGH (ref 4.0–10.5)
nRBC: 0 % (ref 0.0–0.2)

## 2020-10-15 ENCOUNTER — Other Ambulatory Visit (HOSPITAL_COMMUNITY): Payer: Medicare Other

## 2020-10-15 LAB — BASIC METABOLIC PANEL
Anion gap: 7 (ref 5–15)
BUN: <5 mg/dL — ABNORMAL LOW (ref 8–23)
CO2: 25 mmol/L (ref 22–32)
Calcium: 8.8 mg/dL — ABNORMAL LOW (ref 8.9–10.3)
Chloride: 102 mmol/L (ref 98–111)
Creatinine, Ser: 0.51 mg/dL — ABNORMAL LOW (ref 0.61–1.24)
GFR, Estimated: >60 mL/min (ref >60)
Glucose, Bld: 94 mg/dL (ref 70–99)
Potassium: 3.3 mmol/L — ABNORMAL LOW (ref 3.5–5.1)
Sodium: 134 mmol/L — ABNORMAL LOW (ref 135–145)

## 2020-10-16 LAB — MAGNESIUM: Magnesium: 1.4 mg/dL — ABNORMAL LOW (ref 1.7–2.4)

## 2020-10-16 LAB — PHOSPHORUS: Phosphorus: 3.6 mg/dL (ref 2.5–4.6)

## 2020-10-16 LAB — TRIGLYCERIDES: Triglycerides: 51 mg/dL (ref <150)

## 2020-10-18 ENCOUNTER — Other Ambulatory Visit (HOSPITAL_COMMUNITY): Payer: Medicare Other

## 2020-10-19 ENCOUNTER — Other Ambulatory Visit (HOSPITAL_COMMUNITY): Payer: Medicare Other

## 2020-10-19 LAB — MAGNESIUM
Magnesium: 1.5 mg/dL — ABNORMAL LOW (ref 1.7–2.4)
Magnesium: 1.8 mg/dL (ref 1.7–2.4)

## 2020-10-19 LAB — PHOSPHORUS: Phosphorus: 3.6 mg/dL (ref 2.5–4.6)

## 2020-10-20 ENCOUNTER — Other Ambulatory Visit (HOSPITAL_COMMUNITY): Payer: Medicare Other

## 2020-10-20 LAB — URINALYSIS, MICROSCOPIC (REFLEX)

## 2020-10-20 LAB — URINALYSIS, ROUTINE W REFLEX MICROSCOPIC
Bilirubin Urine: NEGATIVE
Glucose, UA: NEGATIVE mg/dL
Ketones, ur: NEGATIVE mg/dL
Nitrite: NEGATIVE
Protein, ur: NEGATIVE mg/dL
Specific Gravity, Urine: <1.005 — ABNORMAL LOW (ref 1.005–1.030)
pH: 6 (ref 5.0–8.0)

## 2020-10-22 ENCOUNTER — Other Ambulatory Visit (HOSPITAL_COMMUNITY): Payer: Medicare Other

## 2020-10-22 LAB — PHOSPHORUS: Phosphorus: 3.7 mg/dL (ref 2.5–4.6)

## 2020-10-22 LAB — URINE CULTURE: Culture: >=100000 — AB

## 2020-10-22 LAB — MAGNESIUM: Magnesium: 1.7 mg/dL (ref 1.7–2.4)

## 2020-10-23 LAB — BASIC METABOLIC PANEL
Anion gap: 10 (ref 5–15)
Anion gap: 9 (ref 5–15)
BUN: 14 mg/dL (ref 8–23)
BUN: 13 mg/dL (ref 8–23)
CO2: 24 mmol/L (ref 22–32)
CO2: 24 mmol/L (ref 22–32)
Calcium: 9.1 mg/dL (ref 8.9–10.3)
Calcium: 9.2 mg/dL (ref 8.9–10.3)
Chloride: 98 mmol/L (ref 98–111)
Chloride: 99 mmol/L (ref 98–111)
Creatinine, Ser: 0.52 mg/dL — ABNORMAL LOW (ref 0.61–1.24)
Creatinine, Ser: 0.52 mg/dL — ABNORMAL LOW (ref 0.61–1.24)
GFR, Estimated: >60 mL/min (ref >60)
GFR, Estimated: >60 mL/min (ref >60)
Glucose, Bld: 90 mg/dL (ref 70–99)
Glucose, Bld: 104 mg/dL — ABNORMAL HIGH (ref 70–99)
Potassium: 4.5 mmol/L (ref 3.5–5.1)
Potassium: 4.7 mmol/L (ref 3.5–5.1)
Sodium: 132 mmol/L — ABNORMAL LOW (ref 135–145)
Sodium: 132 mmol/L — ABNORMAL LOW (ref 135–145)

## 2020-10-23 LAB — CBC
HCT: 35.7 % — ABNORMAL LOW (ref 39.0–52.0)
Hemoglobin: 11.7 g/dL — ABNORMAL LOW (ref 13.0–17.0)
MCH: 30.1 pg (ref 26.0–34.0)
MCHC: 32.8 g/dL (ref 30.0–36.0)
MCV: 91.8 fL (ref 80.0–100.0)
Platelets: 280 10*3/uL (ref 150–400)
RBC: 3.89 MIL/uL — ABNORMAL LOW (ref 4.22–5.81)
RDW: 14.4 % (ref 11.5–15.5)
WBC: 6.1 10*3/uL (ref 4.0–10.5)
nRBC: 0 % (ref 0.0–0.2)

## 2020-10-23 LAB — MAGNESIUM: Magnesium: 1.6 mg/dL — ABNORMAL LOW (ref 1.7–2.4)

## 2020-10-23 LAB — TRIGLYCERIDES: Triglycerides: 110 mg/dL (ref <150)

## 2020-10-24 ENCOUNTER — Other Ambulatory Visit (HOSPITAL_COMMUNITY): Payer: Medicare Other

## 2020-10-24 LAB — MAGNESIUM: Magnesium: 2 mg/dL (ref 1.7–2.4)

## 2020-10-25 ENCOUNTER — Other Ambulatory Visit (HOSPITAL_COMMUNITY): Payer: Medicare Other

## 2020-10-25 LAB — PHOSPHORUS: Phosphorus: 3.8 mg/dL (ref 2.5–4.6)

## 2020-10-25 LAB — MAGNESIUM: Magnesium: 1.9 mg/dL (ref 1.7–2.4)

## 2020-10-26 ENCOUNTER — Other Ambulatory Visit (HOSPITAL_COMMUNITY): Payer: Medicare Other

## 2020-10-26 LAB — MAGNESIUM: Magnesium: 1.8 mg/dL (ref 1.7–2.4)

## 2020-10-26 LAB — BASIC METABOLIC PANEL
Anion gap: 6 (ref 5–15)
BUN: 14 mg/dL (ref 8–23)
CO2: 25 mmol/L (ref 22–32)
Calcium: 9 mg/dL (ref 8.9–10.3)
Chloride: 102 mmol/L (ref 98–111)
Creatinine, Ser: 0.56 mg/dL — ABNORMAL LOW (ref 0.61–1.24)
GFR, Estimated: >60 mL/min (ref >60)
Glucose, Bld: 103 mg/dL — ABNORMAL HIGH (ref 70–99)
Potassium: 4.5 mmol/L (ref 3.5–5.1)
Sodium: 133 mmol/L — ABNORMAL LOW (ref 135–145)

## 2020-10-26 MED ORDER — IOHEXOL 350 MG/ML SOLN
75.0000 mL | Freq: Once | INTRAVENOUS | Status: AC | PRN
  Administered 2020-10-26: 75 mL via INTRAVENOUS

## 2020-10-27 ENCOUNTER — Other Ambulatory Visit (HOSPITAL_COMMUNITY): Payer: Medicare Other

## 2020-10-28 LAB — MAGNESIUM: Magnesium: 1.9 mg/dL (ref 1.7–2.4)

## 2020-10-28 LAB — PHOSPHORUS: Phosphorus: 4.4 mg/dL (ref 2.5–4.6)

## 2020-10-29 ENCOUNTER — Other Ambulatory Visit (HOSPITAL_COMMUNITY): Payer: Medicare Other

## 2020-10-30 ENCOUNTER — Other Ambulatory Visit (HOSPITAL_COMMUNITY): Payer: Medicare Other

## 2020-10-30 LAB — CBC
HCT: 34.6 % — ABNORMAL LOW (ref 39.0–52.0)
Hemoglobin: 11.4 g/dL — ABNORMAL LOW (ref 13.0–17.0)
MCH: 30.2 pg (ref 26.0–34.0)
MCHC: 32.9 g/dL (ref 30.0–36.0)
MCV: 91.5 fL (ref 80.0–100.0)
Platelets: 218 10*3/uL (ref 150–400)
RBC: 3.78 MIL/uL — ABNORMAL LOW (ref 4.22–5.81)
RDW: 14.3 % (ref 11.5–15.5)
WBC: 6.4 10*3/uL (ref 4.0–10.5)
nRBC: 0 % (ref 0.0–0.2)

## 2020-10-30 LAB — BLOOD GAS, ARTERIAL
Acid-Base Excess: 0.9 mmol/L (ref 0.0–2.0)
Bicarbonate: 25.4 mmol/L (ref 20.0–28.0)
FIO2: 21
O2 Saturation: 94.6 %
Patient temperature: 36.2
pCO2 arterial: 41.8 mmHg (ref 32.0–48.0)
pH, Arterial: 7.396 (ref 7.350–7.450)
pO2, Arterial: 72.6 mmHg — ABNORMAL LOW (ref 83.0–108.0)

## 2020-10-30 LAB — BASIC METABOLIC PANEL
Anion gap: 8 (ref 5–15)
BUN: 24 mg/dL — ABNORMAL HIGH (ref 8–23)
CO2: 21 mmol/L — ABNORMAL LOW (ref 22–32)
Calcium: 9.6 mg/dL (ref 8.9–10.3)
Chloride: 103 mmol/L (ref 98–111)
Creatinine, Ser: 0.55 mg/dL — ABNORMAL LOW (ref 0.61–1.24)
GFR, Estimated: >60 mL/min (ref >60)
Glucose, Bld: 92 mg/dL (ref 70–99)
Potassium: 5.5 mmol/L — ABNORMAL HIGH (ref 3.5–5.1)
Sodium: 132 mmol/L — ABNORMAL LOW (ref 135–145)

## 2020-10-30 LAB — MAGNESIUM: Magnesium: 2.1 mg/dL (ref 1.7–2.4)

## 2020-10-30 LAB — TRIGLYCERIDES: Triglycerides: 68 mg/dL (ref <150)

## 2020-10-30 LAB — TROPONIN I (HIGH SENSITIVITY): Troponin I (High Sensitivity): 10 ng/L (ref <18)

## 2020-10-31 LAB — PHOSPHORUS: Phosphorus: 4.4 mg/dL (ref 2.5–4.6)

## 2020-10-31 LAB — MAGNESIUM: Magnesium: 1.9 mg/dL (ref 1.7–2.4)

## 2020-11-01 ENCOUNTER — Other Ambulatory Visit (HOSPITAL_COMMUNITY): Payer: Medicare Other

## 2020-11-01 LAB — CBC
HCT: 32.6 % — ABNORMAL LOW (ref 39.0–52.0)
Hemoglobin: 10.7 g/dL — ABNORMAL LOW (ref 13.0–17.0)
MCH: 30.5 pg (ref 26.0–34.0)
MCHC: 32.8 g/dL (ref 30.0–36.0)
MCV: 92.9 fL (ref 80.0–100.0)
Platelets: 273 10*3/uL (ref 150–400)
RBC: 3.51 MIL/uL — ABNORMAL LOW (ref 4.22–5.81)
RDW: 14.2 % (ref 11.5–15.5)
WBC: 5.8 10*3/uL (ref 4.0–10.5)
nRBC: 0 % (ref 0.0–0.2)

## 2020-11-01 LAB — BASIC METABOLIC PANEL
Anion gap: 6 (ref 5–15)
BUN: 16 mg/dL (ref 8–23)
CO2: 26 mmol/L (ref 22–32)
Calcium: 9.3 mg/dL (ref 8.9–10.3)
Chloride: 103 mmol/L (ref 98–111)
Creatinine, Ser: 0.52 mg/dL — ABNORMAL LOW (ref 0.61–1.24)
GFR, Estimated: >60 mL/min (ref >60)
Glucose, Bld: 121 mg/dL — ABNORMAL HIGH (ref 70–99)
Potassium: 4.2 mmol/L (ref 3.5–5.1)
Sodium: 135 mmol/L (ref 135–145)

## 2020-11-01 LAB — PROCALCITONIN: Procalcitonin: <0.1 ng/mL

## 2020-11-02 LAB — CBC
HCT: 32.3 % — ABNORMAL LOW (ref 39.0–52.0)
Hemoglobin: 10.7 g/dL — ABNORMAL LOW (ref 13.0–17.0)
MCH: 30.2 pg (ref 26.0–34.0)
MCHC: 33.1 g/dL (ref 30.0–36.0)
MCV: 91.2 fL (ref 80.0–100.0)
Platelets: 266 10*3/uL (ref 150–400)
RBC: 3.54 MIL/uL — ABNORMAL LOW (ref 4.22–5.81)
RDW: 14.3 % (ref 11.5–15.5)
WBC: 5.9 10*3/uL (ref 4.0–10.5)
nRBC: 0 % (ref 0.0–0.2)

## 2020-11-02 LAB — COMPREHENSIVE METABOLIC PANEL
ALT: 33 U/L (ref 0–44)
AST: 37 U/L (ref 15–41)
Albumin: 2.7 g/dL — ABNORMAL LOW (ref 3.5–5.0)
Alkaline Phosphatase: 114 U/L (ref 38–126)
Anion gap: 8 (ref 5–15)
BUN: 15 mg/dL (ref 8–23)
CO2: 27 mmol/L (ref 22–32)
Calcium: 9.3 mg/dL (ref 8.9–10.3)
Chloride: 101 mmol/L (ref 98–111)
Creatinine, Ser: 0.53 mg/dL — ABNORMAL LOW (ref 0.61–1.24)
GFR, Estimated: >60 mL/min (ref >60)
Glucose, Bld: 119 mg/dL — ABNORMAL HIGH (ref 70–99)
Potassium: 4.3 mmol/L (ref 3.5–5.1)
Sodium: 136 mmol/L (ref 135–145)
Total Bilirubin: 0.5 mg/dL (ref 0.3–1.2)
Total Protein: 5.9 g/dL — ABNORMAL LOW (ref 6.5–8.1)

## 2020-11-02 LAB — PROCALCITONIN
Procalcitonin: <0.1 ng/mL
Procalcitonin: <0.1 ng/mL

## 2020-11-03 LAB — URINALYSIS, ROUTINE W REFLEX MICROSCOPIC
Bilirubin Urine: NEGATIVE
Glucose, UA: NEGATIVE mg/dL
Hgb urine dipstick: NEGATIVE
Ketones, ur: NEGATIVE mg/dL
Leukocytes,Ua: NEGATIVE
Nitrite: NEGATIVE
Protein, ur: NEGATIVE mg/dL
Specific Gravity, Urine: 1.014 (ref 1.005–1.030)
pH: 6 (ref 5.0–8.0)

## 2020-11-03 LAB — MAGNESIUM: Magnesium: 1.9 mg/dL (ref 1.7–2.4)

## 2020-11-03 LAB — PHOSPHORUS: Phosphorus: 4 mg/dL (ref 2.5–4.6)

## 2020-11-04 ENCOUNTER — Other Ambulatory Visit (HOSPITAL_COMMUNITY): Payer: Medicare Other

## 2020-11-04 LAB — COMPREHENSIVE METABOLIC PANEL
ALT: 42 U/L (ref 0–44)
AST: 44 U/L — ABNORMAL HIGH (ref 15–41)
Albumin: 2.7 g/dL — ABNORMAL LOW (ref 3.5–5.0)
Alkaline Phosphatase: 113 U/L (ref 38–126)
Anion gap: 9 (ref 5–15)
BUN: 20 mg/dL (ref 8–23)
CO2: 25 mmol/L (ref 22–32)
Calcium: 9.2 mg/dL (ref 8.9–10.3)
Chloride: 100 mmol/L (ref 98–111)
Creatinine, Ser: 0.54 mg/dL — ABNORMAL LOW (ref 0.61–1.24)
GFR, Estimated: >60 mL/min (ref >60)
Glucose, Bld: 113 mg/dL — ABNORMAL HIGH (ref 70–99)
Potassium: 4.6 mmol/L (ref 3.5–5.1)
Sodium: 134 mmol/L — ABNORMAL LOW (ref 135–145)
Total Bilirubin: 0.4 mg/dL (ref 0.3–1.2)
Total Protein: 5.8 g/dL — ABNORMAL LOW (ref 6.5–8.1)

## 2020-11-04 LAB — CBC
HCT: 37.7 % — ABNORMAL LOW (ref 39.0–52.0)
Hemoglobin: 12.4 g/dL — ABNORMAL LOW (ref 13.0–17.0)
MCH: 30 pg (ref 26.0–34.0)
MCHC: 32.9 g/dL (ref 30.0–36.0)
MCV: 91.3 fL (ref 80.0–100.0)
Platelets: 290 10*3/uL (ref 150–400)
RBC: 4.13 MIL/uL — ABNORMAL LOW (ref 4.22–5.81)
RDW: 14.1 % (ref 11.5–15.5)
WBC: 6.9 10*3/uL (ref 4.0–10.5)
nRBC: 0 % (ref 0.0–0.2)

## 2020-11-05 LAB — BLOOD GAS, ARTERIAL
Acid-Base Excess: 2.6 mmol/L — ABNORMAL HIGH (ref 0.0–2.0)
Bicarbonate: 26.8 mmol/L (ref 20.0–28.0)
FIO2: 21
O2 Saturation: 96.9 %
Patient temperature: 37
pCO2 arterial: 42.8 mmHg (ref 32.0–48.0)
pH, Arterial: 7.414 (ref 7.350–7.450)
pO2, Arterial: 89.6 mmHg (ref 83.0–108.0)

## 2020-11-06 LAB — MAGNESIUM: Magnesium: 2 mg/dL (ref 1.7–2.4)

## 2020-11-06 LAB — BASIC METABOLIC PANEL
Anion gap: 6 (ref 5–15)
BUN: 29 mg/dL — ABNORMAL HIGH (ref 8–23)
CO2: 30 mmol/L (ref 22–32)
Calcium: 9.3 mg/dL (ref 8.9–10.3)
Chloride: 100 mmol/L (ref 98–111)
Creatinine, Ser: 0.55 mg/dL — ABNORMAL LOW (ref 0.61–1.24)
GFR, Estimated: >60 mL/min (ref >60)
Glucose, Bld: 129 mg/dL — ABNORMAL HIGH (ref 70–99)
Potassium: 4.4 mmol/L (ref 3.5–5.1)
Sodium: 136 mmol/L (ref 135–145)

## 2020-11-06 LAB — AMMONIA: Ammonia: 13 umol/L (ref 9–35)

## 2020-11-06 LAB — PHOSPHORUS: Phosphorus: 4.2 mg/dL (ref 2.5–4.6)

## 2020-11-06 LAB — TSH: TSH: 2.629 u[IU]/mL (ref 0.350–4.500)

## 2020-11-06 LAB — TRIGLYCERIDES: Triglycerides: 115 mg/dL (ref <150)

## 2020-11-07 ENCOUNTER — Other Ambulatory Visit (HOSPITAL_COMMUNITY): Payer: Medicare Other

## 2020-11-07 DIAGNOSIS — R490 Dysphonia: Secondary | ICD-10-CM | POA: Diagnosis not present

## 2020-11-07 DIAGNOSIS — R061 Stridor: Secondary | ICD-10-CM | POA: Diagnosis not present

## 2020-11-07 MED ORDER — IOHEXOL 350 MG/ML SOLN
80.0000 mL | Freq: Once | INTRAVENOUS | Status: AC | PRN
  Administered 2020-11-07: 80 mL via INTRAVENOUS

## 2020-11-09 ENCOUNTER — Other Ambulatory Visit (HOSPITAL_COMMUNITY): Payer: Medicare Other

## 2020-11-09 LAB — BASIC METABOLIC PANEL
Anion gap: 8 (ref 5–15)
BUN: 29 mg/dL — ABNORMAL HIGH (ref 8–23)
CO2: 26 mmol/L (ref 22–32)
Calcium: 9 mg/dL (ref 8.9–10.3)
Chloride: 104 mmol/L (ref 98–111)
Creatinine, Ser: 0.55 mg/dL — ABNORMAL LOW (ref 0.61–1.24)
GFR, Estimated: >60 mL/min (ref >60)
Glucose, Bld: 125 mg/dL — ABNORMAL HIGH (ref 70–99)
Potassium: 5.1 mmol/L (ref 3.5–5.1)
Sodium: 138 mmol/L (ref 135–145)

## 2020-11-09 LAB — CBC
HCT: 33.3 % — ABNORMAL LOW (ref 39.0–52.0)
Hemoglobin: 10.8 g/dL — ABNORMAL LOW (ref 13.0–17.0)
MCH: 30.3 pg (ref 26.0–34.0)
MCHC: 32.4 g/dL (ref 30.0–36.0)
MCV: 93.3 fL (ref 80.0–100.0)
Platelets: 316 10*3/uL (ref 150–400)
RBC: 3.57 MIL/uL — ABNORMAL LOW (ref 4.22–5.81)
RDW: 13.9 % (ref 11.5–15.5)
WBC: 9.7 10*3/uL (ref 4.0–10.5)
nRBC: 0 % (ref 0.0–0.2)

## 2020-11-09 LAB — PHOSPHORUS: Phosphorus: 3.6 mg/dL (ref 2.5–4.6)

## 2020-11-09 LAB — MAGNESIUM: Magnesium: 2.2 mg/dL (ref 1.7–2.4)

## 2020-11-10 LAB — BASIC METABOLIC PANEL
Anion gap: 10 (ref 5–15)
BUN: 24 mg/dL — ABNORMAL HIGH (ref 8–23)
CO2: 20 mmol/L — ABNORMAL LOW (ref 22–32)
Calcium: 9 mg/dL (ref 8.9–10.3)
Chloride: 104 mmol/L (ref 98–111)
Creatinine, Ser: 0.59 mg/dL — ABNORMAL LOW (ref 0.61–1.24)
GFR, Estimated: >60 mL/min (ref >60)
Glucose, Bld: 145 mg/dL — ABNORMAL HIGH (ref 70–99)
Potassium: 5.5 mmol/L — ABNORMAL HIGH (ref 3.5–5.1)
Sodium: 134 mmol/L — ABNORMAL LOW (ref 135–145)

## 2020-11-10 LAB — TROPONIN I (HIGH SENSITIVITY): Troponin I (High Sensitivity): 9 ng/L (ref <18)

## 2020-11-10 LAB — MAGNESIUM: Magnesium: 2.3 mg/dL (ref 1.7–2.4)

## 2020-11-11 ENCOUNTER — Other Ambulatory Visit (HOSPITAL_COMMUNITY): Payer: Medicare Other

## 2020-11-11 LAB — CBC
HCT: 34.7 % — ABNORMAL LOW (ref 39.0–52.0)
Hemoglobin: 11.7 g/dL — ABNORMAL LOW (ref 13.0–17.0)
MCH: 30.4 pg (ref 26.0–34.0)
MCHC: 33.7 g/dL (ref 30.0–36.0)
MCV: 90.1 fL (ref 80.0–100.0)
Platelets: 287 10*3/uL (ref 150–400)
RBC: 3.85 MIL/uL — ABNORMAL LOW (ref 4.22–5.81)
RDW: 13.8 % (ref 11.5–15.5)
WBC: 11.3 10*3/uL — ABNORMAL HIGH (ref 4.0–10.5)
nRBC: 0 % (ref 0.0–0.2)

## 2020-11-11 LAB — BASIC METABOLIC PANEL
Anion gap: 10 (ref 5–15)
BUN: 26 mg/dL — ABNORMAL HIGH (ref 8–23)
CO2: 23 mmol/L (ref 22–32)
Calcium: 8.9 mg/dL (ref 8.9–10.3)
Chloride: 102 mmol/L (ref 98–111)
Creatinine, Ser: 0.48 mg/dL — ABNORMAL LOW (ref 0.61–1.24)
GFR, Estimated: >60 mL/min (ref >60)
Glucose, Bld: 129 mg/dL — ABNORMAL HIGH (ref 70–99)
Potassium: 5.5 mmol/L — ABNORMAL HIGH (ref 3.5–5.1)
Sodium: 135 mmol/L (ref 135–145)

## 2020-11-11 LAB — VITAMIN B1: Vitamin B1 (Thiamine): 205.3 nmol/L — ABNORMAL HIGH (ref 66.5–200.0)

## 2020-11-11 LAB — POTASSIUM: Potassium: 4.1 mmol/L (ref 3.5–5.1)

## 2020-11-11 NOTE — Progress Notes (Signed)
  Echocardiogram 2D Echocardiogram has been performed.  Rick Wilson 11/11/2020, 5:22 PM

## 2020-11-12 LAB — BASIC METABOLIC PANEL
Anion gap: 7 (ref 5–15)
BUN: 19 mg/dL (ref 8–23)
CO2: 28 mmol/L (ref 22–32)
Calcium: 9.1 mg/dL (ref 8.9–10.3)
Chloride: 104 mmol/L (ref 98–111)
Creatinine, Ser: 0.56 mg/dL — ABNORMAL LOW (ref 0.61–1.24)
GFR, Estimated: >60 mL/min (ref >60)
Glucose, Bld: 135 mg/dL — ABNORMAL HIGH (ref 70–99)
Potassium: 4.5 mmol/L (ref 3.5–5.1)
Sodium: 139 mmol/L (ref 135–145)

## 2020-11-12 LAB — CBC
HCT: 34.9 % — ABNORMAL LOW (ref 39.0–52.0)
Hemoglobin: 11.5 g/dL — ABNORMAL LOW (ref 13.0–17.0)
MCH: 30.2 pg (ref 26.0–34.0)
MCHC: 33 g/dL (ref 30.0–36.0)
MCV: 91.6 fL (ref 80.0–100.0)
Platelets: 334 10*3/uL (ref 150–400)
RBC: 3.81 MIL/uL — ABNORMAL LOW (ref 4.22–5.81)
RDW: 14.1 % (ref 11.5–15.5)
WBC: 12.6 10*3/uL — ABNORMAL HIGH (ref 4.0–10.5)
nRBC: 0 % (ref 0.0–0.2)

## 2020-11-12 LAB — MAGNESIUM: Magnesium: 2.4 mg/dL (ref 1.7–2.4)

## 2020-11-12 LAB — PHOSPHORUS: Phosphorus: 4.5 mg/dL (ref 2.5–4.6)

## 2020-11-13 LAB — TRIGLYCERIDES: Triglycerides: 68 mg/dL (ref <150)

## 2020-11-14 LAB — CBC
HCT: 36.9 % — ABNORMAL LOW (ref 39.0–52.0)
Hemoglobin: 11.9 g/dL — ABNORMAL LOW (ref 13.0–17.0)
MCH: 30.1 pg (ref 26.0–34.0)
MCHC: 32.2 g/dL (ref 30.0–36.0)
MCV: 93.4 fL (ref 80.0–100.0)
Platelets: 338 10*3/uL (ref 150–400)
RBC: 3.95 MIL/uL — ABNORMAL LOW (ref 4.22–5.81)
RDW: 14.2 % (ref 11.5–15.5)
WBC: 13.4 10*3/uL — ABNORMAL HIGH (ref 4.0–10.5)
nRBC: 0 % (ref 0.0–0.2)

## 2020-11-15 ENCOUNTER — Other Ambulatory Visit (HOSPITAL_COMMUNITY): Payer: Medicare Other

## 2020-11-15 DIAGNOSIS — G931 Anoxic brain damage, not elsewhere classified: Secondary | ICD-10-CM | POA: Diagnosis not present

## 2020-11-15 LAB — ECHOCARDIOGRAM COMPLETE: S' Lateral: 3.5 cm

## 2020-11-15 LAB — MAGNESIUM: Magnesium: 2.4 mg/dL (ref 1.7–2.4)

## 2020-11-15 LAB — LACTIC ACID, PLASMA: Lactic Acid, Venous: 1.3 mmol/L (ref 0.5–1.9)

## 2020-11-15 LAB — PHOSPHORUS: Phosphorus: 4.2 mg/dL (ref 2.5–4.6)

## 2020-11-15 NOTE — Procedures (Addendum)
Patient Name: Prathik Aman  MRN: [id number]  Epilepsy Attending: Charlsie Quest  Referring Physician/Provider: Dr Joycelyn Rua Date: 11/15/2020 Duration: 23.23 mins  Patient history: 75yo M with chronic anoxic encephalopathy. EEG to evaluate for seizure  Level of alertness: coma  AEDs during EEG study: None  Technical aspects: This EEG study was done with scalp electrodes positioned according to the 10-20 International system of electrode placement. Electrical activity was acquired at a sampling rate of 500Hz  and reviewed with a high frequency filter of 70Hz  and a low frequency filter of 1Hz . EEG data were recorded continuously and digitally stored.   Description: The posterior dominant rhythm consists of 7.5Hz  activity of moderate voltage (25-35 uV) seen predominantly in posterior head regions, symmetric and reactive to eye opening and eye closing. EEG showed continuous generalized 5 to 7 Hz theta slowing. Hyperventilation and photic stimulation were not performed.     ABNORMALITY - Continuous slow, generalized  IMPRESSION: This study is suggestive of mild diffuse encephalopathy, nonspecific etiology. No seizures or epileptiform discharges were seen throughout the recording.  Gursimran Litaker 

## 2020-11-15 NOTE — Progress Notes (Signed)
EEG completed, results pending. 

## 2020-11-16 ENCOUNTER — Other Ambulatory Visit (HOSPITAL_COMMUNITY): Payer: Medicare Other

## 2020-11-16 LAB — AMMONIA: Ammonia: 25 umol/L (ref 9–35)

## 2020-11-16 LAB — BASIC METABOLIC PANEL
Anion gap: 5 (ref 5–15)
BUN: 37 mg/dL — ABNORMAL HIGH (ref 8–23)
CO2: 30 mmol/L (ref 22–32)
Calcium: 8.9 mg/dL (ref 8.9–10.3)
Chloride: 103 mmol/L (ref 98–111)
Creatinine, Ser: 0.9 mg/dL (ref 0.61–1.24)
GFR, Estimated: >60 mL/min (ref >60)
Glucose, Bld: 99 mg/dL (ref 70–99)
Potassium: 5.4 mmol/L — ABNORMAL HIGH (ref 3.5–5.1)
Sodium: 138 mmol/L (ref 135–145)

## 2020-11-16 LAB — CBC
HCT: 30.4 % — ABNORMAL LOW (ref 39.0–52.0)
Hemoglobin: 9.9 g/dL — ABNORMAL LOW (ref 13.0–17.0)
MCH: 30.7 pg (ref 26.0–34.0)
MCHC: 32.6 g/dL (ref 30.0–36.0)
MCV: 94.4 fL (ref 80.0–100.0)
Platelets: 279 10*3/uL (ref 150–400)
RBC: 3.22 MIL/uL — ABNORMAL LOW (ref 4.22–5.81)
RDW: 15 % (ref 11.5–15.5)
WBC: 10.5 10*3/uL (ref 4.0–10.5)
nRBC: 0 % (ref 0.0–0.2)

## 2020-11-17 LAB — VANCOMYCIN, TROUGH: Vancomycin Tr: 25 ug/mL (ref 15–20)

## 2020-11-17 LAB — POTASSIUM: Potassium: 5.1 mmol/L (ref 3.5–5.1)

## 2020-11-18 LAB — URINE CULTURE: Culture: >=100000 — AB

## 2020-11-18 LAB — VANCOMYCIN, TROUGH: Vancomycin Tr: 11 ug/mL — ABNORMAL LOW (ref 15–20)

## 2020-11-18 LAB — PHOSPHORUS: Phosphorus: 4.4 mg/dL (ref 2.5–4.6)

## 2020-11-18 LAB — MAGNESIUM: Magnesium: 2.3 mg/dL (ref 1.7–2.4)

## 2020-11-19 LAB — COMPREHENSIVE METABOLIC PANEL
ALT: 78 U/L — ABNORMAL HIGH (ref 0–44)
AST: 44 U/L — ABNORMAL HIGH (ref 15–41)
Albumin: 2.5 g/dL — ABNORMAL LOW (ref 3.5–5.0)
Alkaline Phosphatase: 101 U/L (ref 38–126)
Anion gap: 5 (ref 5–15)
BUN: 25 mg/dL — ABNORMAL HIGH (ref 8–23)
CO2: 29 mmol/L (ref 22–32)
Calcium: 9 mg/dL (ref 8.9–10.3)
Chloride: 100 mmol/L (ref 98–111)
Creatinine, Ser: 0.57 mg/dL — ABNORMAL LOW (ref 0.61–1.24)
GFR, Estimated: >60 mL/min (ref >60)
Glucose, Bld: 108 mg/dL — ABNORMAL HIGH (ref 70–99)
Potassium: 5.2 mmol/L — ABNORMAL HIGH (ref 3.5–5.1)
Sodium: 134 mmol/L — ABNORMAL LOW (ref 135–145)
Total Bilirubin: 0.5 mg/dL (ref 0.3–1.2)
Total Protein: 5.3 g/dL — ABNORMAL LOW (ref 6.5–8.1)

## 2020-11-19 LAB — CBC
HCT: 31.1 % — ABNORMAL LOW (ref 39.0–52.0)
Hemoglobin: 10 g/dL — ABNORMAL LOW (ref 13.0–17.0)
MCH: 30.4 pg (ref 26.0–34.0)
MCHC: 32.2 g/dL (ref 30.0–36.0)
MCV: 94.5 fL (ref 80.0–100.0)
Platelets: 223 10*3/uL (ref 150–400)
RBC: 3.29 MIL/uL — ABNORMAL LOW (ref 4.22–5.81)
RDW: 14.6 % (ref 11.5–15.5)
WBC: 10.9 10*3/uL — ABNORMAL HIGH (ref 4.0–10.5)
nRBC: 0 % (ref 0.0–0.2)

## 2020-11-20 ENCOUNTER — Other Ambulatory Visit (HOSPITAL_COMMUNITY): Payer: Medicare Other

## 2020-11-20 LAB — BASIC METABOLIC PANEL
Anion gap: 6 (ref 5–15)
BUN: 23 mg/dL (ref 8–23)
CO2: 29 mmol/L (ref 22–32)
Calcium: 8.9 mg/dL (ref 8.9–10.3)
Chloride: 100 mmol/L (ref 98–111)
Creatinine, Ser: 0.61 mg/dL (ref 0.61–1.24)
GFR, Estimated: >60 mL/min (ref >60)
Glucose, Bld: 108 mg/dL — ABNORMAL HIGH (ref 70–99)
Potassium: 4.5 mmol/L (ref 3.5–5.1)
Sodium: 135 mmol/L (ref 135–145)

## 2020-11-20 LAB — CBC
HCT: 28.8 % — ABNORMAL LOW (ref 39.0–52.0)
Hemoglobin: 9.6 g/dL — ABNORMAL LOW (ref 13.0–17.0)
MCH: 31.1 pg (ref 26.0–34.0)
MCHC: 33.3 g/dL (ref 30.0–36.0)
MCV: 93.2 fL (ref 80.0–100.0)
Platelets: 207 10*3/uL (ref 150–400)
RBC: 3.09 MIL/uL — ABNORMAL LOW (ref 4.22–5.81)
RDW: 14.5 % (ref 11.5–15.5)
WBC: 10.8 10*3/uL — ABNORMAL HIGH (ref 4.0–10.5)
nRBC: 0 % (ref 0.0–0.2)

## 2020-11-20 LAB — RESP PANEL BY RT-PCR (FLU A&B, COVID) ARPGX2
Influenza A by PCR: NEGATIVE
Influenza B by PCR: NEGATIVE
SARS Coronavirus 2 by RT PCR: NEGATIVE

## 2020-11-20 LAB — TRIGLYCERIDES: Triglycerides: 52 mg/dL (ref <150)

## 2020-11-21 LAB — CULTURE, BLOOD (ROUTINE X 2)
Culture: NO GROWTH
Culture: NO GROWTH
Special Requests: ADEQUATE
Special Requests: ADEQUATE

## 2020-11-21 LAB — PHOSPHORUS: Phosphorus: 3.8 mg/dL (ref 2.5–4.6)

## 2020-11-21 LAB — MAGNESIUM: Magnesium: 1.9 mg/dL (ref 1.7–2.4)

## 2020-11-24 LAB — MAGNESIUM: Magnesium: 1.9 mg/dL (ref 1.7–2.4)

## 2020-11-24 LAB — PHOSPHORUS: Phosphorus: 4.2 mg/dL (ref 2.5–4.6)

## 2020-11-26 ENCOUNTER — Other Ambulatory Visit (HOSPITAL_COMMUNITY): Payer: Medicare Other

## 2020-11-26 LAB — COMPREHENSIVE METABOLIC PANEL
ALT: 47 U/L — ABNORMAL HIGH (ref 0–44)
AST: 31 U/L (ref 15–41)
Albumin: 3 g/dL — ABNORMAL LOW (ref 3.5–5.0)
Alkaline Phosphatase: 108 U/L (ref 38–126)
Anion gap: 10 (ref 5–15)
BUN: 35 mg/dL — ABNORMAL HIGH (ref 8–23)
CO2: 28 mmol/L (ref 22–32)
Calcium: 9.2 mg/dL (ref 8.9–10.3)
Chloride: 102 mmol/L (ref 98–111)
Creatinine, Ser: 1.48 mg/dL — ABNORMAL HIGH (ref 0.61–1.24)
GFR, Estimated: 50 mL/min — ABNORMAL LOW (ref >60)
Glucose, Bld: 134 mg/dL — ABNORMAL HIGH (ref 70–99)
Potassium: 4.5 mmol/L (ref 3.5–5.1)
Sodium: 140 mmol/L (ref 135–145)
Total Bilirubin: 0.8 mg/dL (ref 0.3–1.2)
Total Protein: 6 g/dL — ABNORMAL LOW (ref 6.5–8.1)

## 2020-11-26 LAB — URINALYSIS, ROUTINE W REFLEX MICROSCOPIC
Bacteria, UA: NONE SEEN
Bilirubin Urine: NEGATIVE
Glucose, UA: NEGATIVE mg/dL
Ketones, ur: 5 mg/dL — AB
Nitrite: NEGATIVE
Protein, ur: 30 mg/dL — AB
Specific Gravity, Urine: 1.015 (ref 1.005–1.030)
pH: 5 (ref 5.0–8.0)

## 2020-11-26 LAB — CBC
HCT: 35.7 % — ABNORMAL LOW (ref 39.0–52.0)
Hemoglobin: 11.3 g/dL — ABNORMAL LOW (ref 13.0–17.0)
MCH: 30.2 pg (ref 26.0–34.0)
MCHC: 31.7 g/dL (ref 30.0–36.0)
MCV: 95.5 fL (ref 80.0–100.0)
Platelets: 213 10*3/uL (ref 150–400)
RBC: 3.74 MIL/uL — ABNORMAL LOW (ref 4.22–5.81)
RDW: 15.1 % (ref 11.5–15.5)
WBC: 11.8 10*3/uL — ABNORMAL HIGH (ref 4.0–10.5)
nRBC: 0 % (ref 0.0–0.2)

## 2020-11-27 LAB — URINE CULTURE: Culture: NO GROWTH

## 2020-11-27 LAB — TRIGLYCERIDES: Triglycerides: 51 mg/dL (ref <150)

## 2020-11-28 LAB — BASIC METABOLIC PANEL
Anion gap: 5 (ref 5–15)
BUN: 33 mg/dL — ABNORMAL HIGH (ref 8–23)
CO2: 32 mmol/L (ref 22–32)
Calcium: 9 mg/dL (ref 8.9–10.3)
Chloride: 106 mmol/L (ref 98–111)
Creatinine, Ser: 0.91 mg/dL (ref 0.61–1.24)
GFR, Estimated: >60 mL/min (ref >60)
Glucose, Bld: 94 mg/dL (ref 70–99)
Potassium: 4.1 mmol/L (ref 3.5–5.1)
Sodium: 143 mmol/L (ref 135–145)

## 2020-11-28 LAB — CBC
HCT: 33.1 % — ABNORMAL LOW (ref 39.0–52.0)
Hemoglobin: 10.5 g/dL — ABNORMAL LOW (ref 13.0–17.0)
MCH: 30.6 pg (ref 26.0–34.0)
MCHC: 31.7 g/dL (ref 30.0–36.0)
MCV: 96.5 fL (ref 80.0–100.0)
Platelets: 191 10*3/uL (ref 150–400)
RBC: 3.43 MIL/uL — ABNORMAL LOW (ref 4.22–5.81)
RDW: 15.4 % (ref 11.5–15.5)
WBC: 10.1 10*3/uL (ref 4.0–10.5)
nRBC: 0 % (ref 0.0–0.2)

## 2023-05-25 IMAGING — DX DG ABD PORTABLE 1V
1 series · 1 of 1 positions shown · non-contrast
Comparison: 11/11/2020

CLINICAL DATA: Ileus

EXAM:
PORTABLE ABDOMEN - 1 VIEW

[abdomen kub]
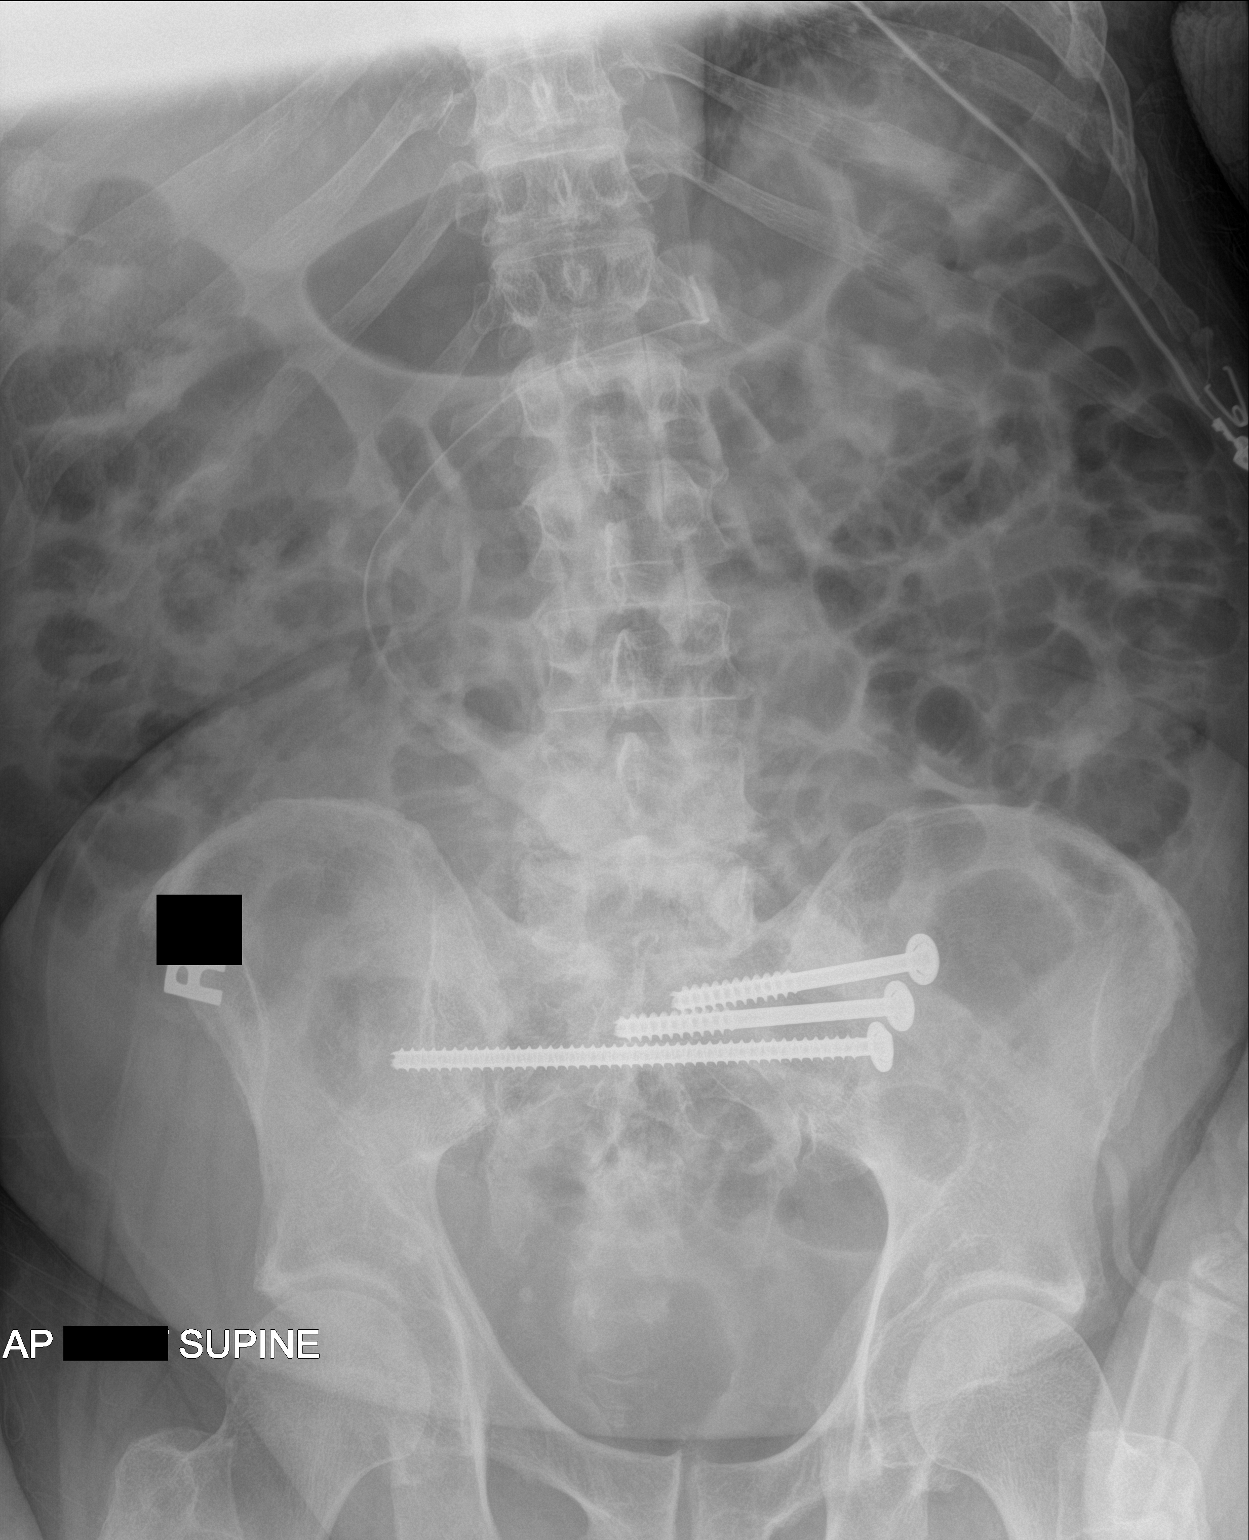

[1 of 1 positions shown; findings below may reference images not displayed]

FINDINGS: Gastrostomy tube in expected location. The stomach is nondistended.
Multiple gas-filled but nondilated small bowel loops. The colon is
nondilated. Stable fixation hardware across the sacroiliac joints.
Spondylitic changes in the lower lumbar spine.
IMPRESSION: Nonobstructive bowel gas pattern.
